# Patient Record
Sex: Female | Born: 1935 | Race: Black or African American | Hispanic: No | Smoking: Former smoker
Health system: Southern US, Community
[De-identification: ages and names within clinical notes are randomized; demographics above are authoritative.]

## PROBLEM LIST (undated history)

## (undated) DIAGNOSIS — Z972 Presence of dental prosthetic device (complete) (partial): Secondary | ICD-10-CM

## (undated) DIAGNOSIS — K219 Gastro-esophageal reflux disease without esophagitis: Secondary | ICD-10-CM

## (undated) DIAGNOSIS — I10 Essential (primary) hypertension: Secondary | ICD-10-CM

## (undated) DIAGNOSIS — E119 Type 2 diabetes mellitus without complications: Secondary | ICD-10-CM

## (undated) DIAGNOSIS — M1990 Unspecified osteoarthritis, unspecified site: Secondary | ICD-10-CM

## (undated) DIAGNOSIS — I2699 Other pulmonary embolism without acute cor pulmonale: Secondary | ICD-10-CM

## (undated) HISTORY — PX: ABDOMINAL HYSTERECTOMY: SHX81

## (undated) HISTORY — PX: CHOLECYSTECTOMY: SHX55

---

## 2004-12-05 ENCOUNTER — Emergency Department: Payer: Self-pay | Admitting: Emergency Medicine

## 2005-01-05 ENCOUNTER — Ambulatory Visit: Payer: Self-pay | Admitting: Ophthalmology

## 2005-01-10 ENCOUNTER — Ambulatory Visit: Payer: Self-pay | Admitting: Ophthalmology

## 2005-01-12 ENCOUNTER — Ambulatory Visit: Payer: Self-pay | Admitting: Internal Medicine

## 2005-01-16 ENCOUNTER — Ambulatory Visit: Payer: Self-pay | Admitting: Unknown Physician Specialty

## 2005-02-08 ENCOUNTER — Ambulatory Visit: Payer: Self-pay | Admitting: Internal Medicine

## 2005-03-23 ENCOUNTER — Ambulatory Visit: Payer: Self-pay | Admitting: Internal Medicine

## 2005-04-10 ENCOUNTER — Ambulatory Visit: Payer: Self-pay | Admitting: Internal Medicine

## 2005-06-28 ENCOUNTER — Ambulatory Visit: Payer: Self-pay | Admitting: Internal Medicine

## 2005-07-11 ENCOUNTER — Ambulatory Visit: Payer: Self-pay | Admitting: Internal Medicine

## 2005-09-21 ENCOUNTER — Ambulatory Visit: Payer: Self-pay | Admitting: Internal Medicine

## 2005-10-11 ENCOUNTER — Ambulatory Visit: Payer: Self-pay | Admitting: Internal Medicine

## 2006-01-22 ENCOUNTER — Ambulatory Visit: Payer: Self-pay | Admitting: Unknown Physician Specialty

## 2006-03-26 ENCOUNTER — Ambulatory Visit: Payer: Self-pay | Admitting: Internal Medicine

## 2006-04-10 ENCOUNTER — Ambulatory Visit: Payer: Self-pay | Admitting: Internal Medicine

## 2006-09-25 ENCOUNTER — Ambulatory Visit: Payer: Self-pay | Admitting: Internal Medicine

## 2006-10-11 ENCOUNTER — Ambulatory Visit: Payer: Self-pay | Admitting: Internal Medicine

## 2007-01-29 ENCOUNTER — Ambulatory Visit: Payer: Self-pay | Admitting: Unknown Physician Specialty

## 2007-08-01 ENCOUNTER — Ambulatory Visit: Payer: Self-pay | Admitting: Unknown Physician Specialty

## 2008-04-30 ENCOUNTER — Ambulatory Visit: Payer: Self-pay | Admitting: Unknown Physician Specialty

## 2009-05-04 ENCOUNTER — Ambulatory Visit: Payer: Self-pay | Admitting: Unknown Physician Specialty

## 2009-10-22 ENCOUNTER — Ambulatory Visit: Payer: Self-pay | Admitting: Unknown Physician Specialty

## 2010-01-10 ENCOUNTER — Ambulatory Visit: Payer: Self-pay | Admitting: Unknown Physician Specialty

## 2010-01-20 ENCOUNTER — Ambulatory Visit: Payer: Self-pay | Admitting: Unknown Physician Specialty

## 2010-01-26 ENCOUNTER — Ambulatory Visit: Payer: Self-pay | Admitting: Unknown Physician Specialty

## 2010-06-03 ENCOUNTER — Ambulatory Visit: Payer: Self-pay | Admitting: Unknown Physician Specialty

## 2010-11-27 ENCOUNTER — Emergency Department: Payer: Self-pay | Admitting: Internal Medicine

## 2011-06-27 ENCOUNTER — Ambulatory Visit: Payer: Self-pay | Admitting: Unknown Physician Specialty

## 2012-06-27 ENCOUNTER — Ambulatory Visit: Payer: Self-pay | Admitting: Unknown Physician Specialty

## 2013-06-09 ENCOUNTER — Ambulatory Visit: Payer: Self-pay | Admitting: Orthopedic Surgery

## 2013-09-30 ENCOUNTER — Ambulatory Visit: Payer: Self-pay | Admitting: Internal Medicine

## 2014-07-18 ENCOUNTER — Emergency Department: Payer: Self-pay | Admitting: Emergency Medicine

## 2014-07-18 LAB — COMPREHENSIVE METABOLIC PANEL
ALK PHOS: 90 U/L
ANION GAP: 7 (ref 7–16)
Albumin: 3.3 g/dL — ABNORMAL LOW (ref 3.4–5.0)
BILIRUBIN TOTAL: 0.5 mg/dL (ref 0.2–1.0)
BUN: 34 mg/dL — ABNORMAL HIGH (ref 7–18)
CO2: 25 mmol/L (ref 21–32)
Calcium, Total: 8.9 mg/dL (ref 8.5–10.1)
Chloride: 103 mmol/L (ref 98–107)
Creatinine: 1.48 mg/dL — ABNORMAL HIGH (ref 0.60–1.30)
EGFR (African American): 37 — ABNORMAL LOW
EGFR (Non-African Amer.): 32 — ABNORMAL LOW
Glucose: 189 mg/dL — ABNORMAL HIGH (ref 65–99)
Osmolality: 283 (ref 275–301)
Potassium: 4.1 mmol/L (ref 3.5–5.1)
SGOT(AST): 14 U/L — ABNORMAL LOW (ref 15–37)
SGPT (ALT): 20 U/L
SODIUM: 135 mmol/L — AB (ref 136–145)
Total Protein: 7.2 g/dL (ref 6.4–8.2)

## 2014-07-18 LAB — CBC WITH DIFFERENTIAL/PLATELET
Basophil #: 0.1 10*3/uL (ref 0.0–0.1)
Basophil %: 1.3 %
Eosinophil #: 0.1 10*3/uL (ref 0.0–0.7)
Eosinophil %: 1.6 %
HCT: 38.4 % (ref 35.0–47.0)
HGB: 13 g/dL (ref 12.0–16.0)
Lymphocyte #: 2 10*3/uL (ref 1.0–3.6)
Lymphocyte %: 33.3 %
MCH: 27.6 pg (ref 26.0–34.0)
MCHC: 33.8 g/dL (ref 32.0–36.0)
MCV: 82 fL (ref 80–100)
MONOS PCT: 7.1 %
Monocyte #: 0.4 x10 3/mm (ref 0.2–0.9)
NEUTROS ABS: 3.5 10*3/uL (ref 1.4–6.5)
NEUTROS PCT: 56.7 %
Platelet: 201 10*3/uL (ref 150–440)
RBC: 4.69 10*6/uL (ref 3.80–5.20)
RDW: 14.8 % — AB (ref 11.5–14.5)
WBC: 6.1 10*3/uL (ref 3.6–11.0)

## 2014-07-18 LAB — URINALYSIS, COMPLETE
BLOOD: NEGATIVE
Bilirubin,UR: NEGATIVE
Glucose,UR: NEGATIVE mg/dL (ref 0–75)
KETONE: NEGATIVE
Nitrite: NEGATIVE
PH: 6 (ref 4.5–8.0)
PROTEIN: NEGATIVE
RBC,UR: 3 /HPF (ref 0–5)
SPECIFIC GRAVITY: 1.005 (ref 1.003–1.030)

## 2014-07-18 LAB — LIPASE, BLOOD: LIPASE: 211 U/L (ref 73–393)

## 2014-12-15 ENCOUNTER — Ambulatory Visit: Payer: Self-pay | Admitting: Internal Medicine

## 2015-04-08 ENCOUNTER — Other Ambulatory Visit: Payer: Self-pay | Admitting: Internal Medicine

## 2015-04-08 DIAGNOSIS — R1032 Left lower quadrant pain: Secondary | ICD-10-CM

## 2015-04-14 ENCOUNTER — Ambulatory Visit: Payer: Self-pay

## 2015-04-15 ENCOUNTER — Ambulatory Visit
Admission: RE | Admit: 2015-04-15 | Discharge: 2015-04-15 | Disposition: A | Discharge status: Home / Self Care | Payer: Medicare Other | Source: Ambulatory Visit | Attending: Internal Medicine | Admitting: Internal Medicine

## 2015-04-15 DIAGNOSIS — K760 Fatty (change of) liver, not elsewhere classified: Secondary | ICD-10-CM | POA: Insufficient documentation

## 2015-04-15 DIAGNOSIS — K5730 Diverticulosis of large intestine without perforation or abscess without bleeding: Secondary | ICD-10-CM | POA: Diagnosis not present

## 2015-04-15 DIAGNOSIS — R1032 Left lower quadrant pain: Secondary | ICD-10-CM

## 2015-04-15 HISTORY — DX: Type 2 diabetes mellitus without complications: E11.9

## 2015-04-15 HISTORY — DX: Essential (primary) hypertension: I10

## 2015-04-15 MED ORDER — IOHEXOL 300 MG/ML  SOLN
100.0000 mL | Freq: Once | INTRAMUSCULAR | Status: AC | PRN
  Administered 2015-04-15: 100 mL via INTRAVENOUS

## 2015-10-15 ENCOUNTER — Emergency Department: Payer: Medicare Other

## 2015-10-15 ENCOUNTER — Encounter: Payer: Self-pay | Admitting: *Deleted

## 2015-10-15 ENCOUNTER — Emergency Department
Admission: EM | Admit: 2015-10-15 | Discharge: 2015-10-15 | Disposition: A | Discharge status: Home / Self Care | Payer: Medicare Other | Attending: Emergency Medicine | Admitting: Emergency Medicine

## 2015-10-15 DIAGNOSIS — R1032 Left lower quadrant pain: Secondary | ICD-10-CM | POA: Insufficient documentation

## 2015-10-15 DIAGNOSIS — I10 Essential (primary) hypertension: Secondary | ICD-10-CM | POA: Insufficient documentation

## 2015-10-15 DIAGNOSIS — R000 Tachycardia, unspecified: Secondary | ICD-10-CM | POA: Insufficient documentation

## 2015-10-15 DIAGNOSIS — E119 Type 2 diabetes mellitus without complications: Secondary | ICD-10-CM | POA: Diagnosis not present

## 2015-10-15 DIAGNOSIS — Z87891 Personal history of nicotine dependence: Secondary | ICD-10-CM | POA: Diagnosis not present

## 2015-10-15 DIAGNOSIS — R109 Unspecified abdominal pain: Secondary | ICD-10-CM | POA: Diagnosis present

## 2015-10-15 LAB — COMPREHENSIVE METABOLIC PANEL
ALBUMIN: 4.1 g/dL (ref 3.5–5.0)
ALT: 17 U/L (ref 14–54)
ANION GAP: 10 (ref 5–15)
AST: 21 U/L (ref 15–41)
Alkaline Phosphatase: 84 U/L (ref 38–126)
BUN: 26 mg/dL — ABNORMAL HIGH (ref 6–20)
CALCIUM: 9.9 mg/dL (ref 8.9–10.3)
CHLORIDE: 105 mmol/L (ref 101–111)
CO2: 25 mmol/L (ref 22–32)
Creatinine, Ser: 1.16 mg/dL — ABNORMAL HIGH (ref 0.44–1.00)
GFR calc non Af Amer: 41 mL/min — ABNORMAL LOW (ref >60)
GFR, EST AFRICAN AMERICAN: 48 mL/min — AB (ref >60)
GLUCOSE: 107 mg/dL — AB (ref 65–99)
POTASSIUM: 4.1 mmol/L (ref 3.5–5.1)
SODIUM: 140 mmol/L (ref 135–145)
Total Bilirubin: 0.8 mg/dL (ref 0.3–1.2)
Total Protein: 7.8 g/dL (ref 6.5–8.1)

## 2015-10-15 LAB — CBC
HEMATOCRIT: 39 % (ref 35.0–47.0)
HEMOGLOBIN: 13.3 g/dL (ref 12.0–16.0)
MCH: 28.1 pg (ref 26.0–34.0)
MCHC: 34.2 g/dL (ref 32.0–36.0)
MCV: 82.2 fL (ref 80.0–100.0)
Platelets: 181 10*3/uL (ref 150–440)
RBC: 4.74 MIL/uL (ref 3.80–5.20)
RDW: 14.4 % (ref 11.5–14.5)
WBC: 8.3 10*3/uL (ref 3.6–11.0)

## 2015-10-15 LAB — URINALYSIS COMPLETE WITH MICROSCOPIC (ARMC ONLY)
Bilirubin Urine: NEGATIVE
GLUCOSE, UA: NEGATIVE mg/dL
HGB URINE DIPSTICK: NEGATIVE
Ketones, ur: NEGATIVE mg/dL
Nitrite: NEGATIVE
PROTEIN: NEGATIVE mg/dL
SPECIFIC GRAVITY, URINE: 1.004 — AB (ref 1.005–1.030)
pH: 5 (ref 5.0–8.0)

## 2015-10-15 LAB — LIPASE, BLOOD: LIPASE: 32 U/L (ref 11–51)

## 2015-10-15 MED ORDER — SODIUM CHLORIDE 0.9 % IV BOLUS (SEPSIS)
500.0000 mL | INTRAVENOUS | Status: AC
  Administered 2015-10-15: 500 mL via INTRAVENOUS

## 2015-10-15 MED ORDER — IOHEXOL 240 MG/ML SOLN
25.0000 mL | Freq: Once | INTRAMUSCULAR | Status: AC | PRN
  Administered 2015-10-15: 25 mL via ORAL

## 2015-10-15 MED ORDER — HYDROCODONE-ACETAMINOPHEN 5-325 MG PO TABS: 1.0000 | ORAL_TABLET | ORAL | Status: DC | PRN

## 2015-10-15 MED ORDER — MORPHINE SULFATE (PF) 4 MG/ML IV SOLN
4.0000 mg | Freq: Once | INTRAVENOUS | Status: AC
  Administered 2015-10-15: 4 mg via INTRAVENOUS
  Filled 2015-10-15: qty 1

## 2015-10-15 MED ORDER — IOHEXOL 300 MG/ML  SOLN
100.0000 mL | Freq: Once | INTRAMUSCULAR | Status: AC | PRN
  Administered 2015-10-15: 100 mL via INTRAVENOUS

## 2015-10-15 MED ORDER — DOCUSATE SODIUM 100 MG PO CAPS: ORAL_CAPSULE | ORAL | Status: DC

## 2015-10-15 MED ORDER — ACETAMINOPHEN 500 MG PO TABS
1000.0000 mg | ORAL_TABLET | Freq: Once | ORAL | Status: AC
  Administered 2015-10-15: 1000 mg via ORAL
  Filled 2015-10-15: qty 2

## 2015-10-15 MED ORDER — ONDANSETRON HCL 4 MG/2ML IJ SOLN
4.0000 mg | Freq: Once | INTRAMUSCULAR | Status: AC
  Administered 2015-10-15: 4 mg via INTRAVENOUS
  Filled 2015-10-15: qty 2

## 2015-10-15 NOTE — ED Provider Notes (Signed)
[facility] Emergency Department Provider Note  ____________________________________________  Time seen: Approximately 4:16 PM  I have reviewed the triage vital signs and the nursing notes.   HISTORY  Chief Complaint Abdominal Pain    HPI Jillian Moore is a 81 y.o. female with IDDM and HTN presents with worsening LLQ abd pain for about 1 week.  She was prescribed Cipro and Flagyl as empiric treatment about 3 days ago, but she has gotten worse in spite of that.  She states that her pain was not significant 3 days ago, but it is severe today.  She describes it as a sharp stabbing pain in her left lower quadrant that radiates up into her left flank somewhat.  She denies nausea, vomiting, fever/chills, chest pain, shortness of breath, dysuria, hematuria.  She has never had any kidney stones.  Movement and pressure on her abdomen make the pain worse.  Rest makes it a bit better.  Past Medical History  Diagnosis Date  . Diabetes mellitus without complication (HCC)     Patient takes Metformin and Insulin  . Hypertension     There are no active problems to display for this patient.   No past surgical history on file.  Current Outpatient Rx  Name  Route  Sig  Dispense  Refill  . docusate sodium (COLACE) 100 MG capsule      Take 1 tablet once or twice daily as needed for constipation while taking narcotic pain medicine   30 capsule   0   . HYDROcodone-acetaminophen (NORCO/VICODIN) 5-325 MG tablet   Oral   Take 1-2 tablets by mouth every 4 (four) hours as needed for moderate pain.   15 tablet   0     Allergies Review of patient's allergies indicates no known allergies.  No family history on file.  Social History Social History  Substance Use Topics  . Smoking status: Former Games developer  . Smokeless tobacco: Not on file  . Alcohol Use: No    Review of Systems Constitutional: No fever/chills Eyes: No visual changes. ENT: No sore  throat. Cardiovascular: Denies chest pain. Respiratory: Denies shortness of breath. Gastrointestinal: Left lower quadrant abdominal pain.  No nausea, no vomiting.  No diarrhea.  No constipation. Genitourinary: Negative for dysuria. Musculoskeletal: Negative for back pain. Skin: Negative for rash. Neurological: Negative for headaches, focal weakness or numbness.  10-point ROS otherwise negative.  ____________________________________________   PHYSICAL EXAM:  VITAL SIGNS: ED Triage Vitals  Enc Vitals Group     BP 10/15/15 1304 155/86 mmHg     Pulse Rate 10/15/15 1304 105     Resp 10/15/15 1304 18     Temp 10/15/15 1304 98.3 F (36.8 C)     Temp Source 10/15/15 1304 Oral     SpO2 10/15/15 1304 99 %     Weight 10/15/15 1304 184 lb (83.462 kg)     Height 10/15/15 1304  (1.575 m)     Head Cir --      Peak Flow --      Pain Score 10/15/15 1305 7     Pain Loc --      Pain Edu? --      Excl. in GC? --     Constitutional: Alert and oriented. Well appearing and in no acute distress. Eyes: Conjunctivae are normal. PERRL. EOMI. Head: Atraumatic. Nose: No congestion/rhinnorhea. Mouth/Throat: Mucous membranes are moist.  Oropharynx non-erythematous. Neck: No stridor.   Cardiovascular: Tachycardic, regular rhythm. Grossly normal heart sounds.  Good peripheral circulation. Respiratory: Normal respiratory effort.  No retractions. Lungs CTAB. Gastrointestinal: Soft with moderate tenderness to palpation of the left lower quadrant with no rebound tenderness or guarding. No distention. No abdominal bruits. No CVA tenderness. Musculoskeletal: No lower extremity tenderness nor edema.  No joint effusions. Neurologic:  Normal speech and language. No gross focal neurologic deficits are appreciated.  Skin:  Skin is warm, dry and intact. No rash noted. Psychiatric: Mood and affect are normal. Speech and behavior are normal.  ____________________________________________   LABS (all labs  ordered are listed, but only abnormal results are displayed)  Labs Reviewed  COMPREHENSIVE METABOLIC PANEL - Abnormal; Notable for the following:    Glucose, Bld 107 (*)    BUN 26 (*)    Creatinine, Ser 1.16 (*)    GFR calc non Af Amer 41 (*)    GFR calc Af Amer 48 (*)    All other components within normal limits  URINALYSIS COMPLETEWITH MICROSCOPIC ([facility] ONLY) - Abnormal; Notable for the following:    Color, Urine STRAW (*)    APPearance CLEAR (*)    Specific Gravity, Urine 1.004 (*)    Leukocytes, UA 1+ (*)    Bacteria, UA RARE (*)    Squamous Epithelial / LPF 0-5 (*)    All other components within normal limits  LIPASE, BLOOD  CBC   ____________________________________________  EKG  ED ECG REPORT I, Gila Lauf, the attending physician, personally viewed and interpreted this ECG.  Date: 10/15/2015 EKG Time: 17:02 Rate: 107 Rhythm: Sinus tachycardia QRS Axis: normal Intervals: normal ST/T Wave abnormalities: normal Conduction Disutrbances: none Narrative Interpretation: unremarkable ____________________________________________  RADIOLOGY   Ct Abdomen Pelvis W Contrast  10/15/2015  CLINICAL DATA:  Left lower abdominal pain for the past week, increased. EXAM: CT ABDOMEN AND PELVIS WITH CONTRAST TECHNIQUE: Multidetector CT imaging of the abdomen and pelvis was performed using the standard protocol following bolus administration of intravenous contrast. CONTRAST:  100mL OMNIPAQUE IOHEXOL 300 MG/ML  SOLN COMPARISON:  04/15/2015. FINDINGS: Lower chest: Stable small amount of bronchiectasis in the medial aspect of the right lower lobe. Hepatobiliary: Cholecystectomy clips.  Unremarkable liver. Pancreas: No mass, inflammatory changes, or other significant abnormality. Spleen: Within normal limits in size and appearance. Adrenals/Urinary Tract: Small lower pole left renal cyst. No masses identified. No evidence of hydronephrosis. Stomach/Bowel: Scattered colonic diverticula  without evidence of diverticulitis. No evidence of appendicitis. No gastric or small bowel abnormalities. Vascular/Lymphatic: Atheromatous arterial calcifications. No enlarged lymph nodes. Reproductive: Surgically absent uterus.  No adnexal masses. Other: None. Musculoskeletal:  Lower thoracic spine degenerative changes. IMPRESSION: 1. No acute abnormality. 2. Colonic diverticulosis without evidence of diverticulitis. Electronically Signed   By: Beckie SaltsSteven  Reid M.D.   On: 10/15/2015 19:53    ____________________________________________   PROCEDURES  Procedure(s) performed: None  Critical Care performed: No ____________________________________________   INITIAL IMPRESSION / ASSESSMENT AND PLAN / ED COURSE  Pertinent labs & imaging results that were available during my care of the patient were reviewed by me and considered in my medical decision making (see chart for details).  Well appearing but mildly tachycardic with severe abdominal pain in spite of empiric antibiotics.  Labs unremarkable.  I will obtain a CT scan for further evaluation.  I suspect diverticulitis.  ----------------------------------------- 8:20 PM on 10/15/2015 -----------------------------------------  The patient has remained calm and quiet in the emergency department.  Her CT scan was unremarkable.  She continues to complain of pain in the left lower quadrant, but on reexamination  she has very minimal tenderness to palpation.  I explained to her that her CT scan is unremarkable, as are her labs.  I discussed the fact that she is slightly tachycardic but this may be due to her discomfort.  I had an extensive discussion with her about undifferentiated abdominal pain and explained that she should follow-up with her regular doctor as soon as possible but return to the emergency department with any new or worsening symptoms.  I recommended that she take over-the-counter pain medicine as needed and only use the Norco if  absolutely necessary for her discomfort she and her family member understand and agree with the plan. ____________________________________________  FINAL CLINICAL IMPRESSION(S) / ED DIAGNOSES  Final diagnoses:  LLQ abdominal pain      NEW MEDICATIONS STARTED DURING THIS VISIT:  New Prescriptions   DOCUSATE SODIUM (COLACE) 100 MG CAPSULE    Take 1 tablet once or twice daily as needed for constipation while taking narcotic pain medicine   HYDROCODONE-ACETAMINOPHEN (NORCO/VICODIN) 5-325 MG TABLET    Take 1-2 tablets by mouth every 4 (four) hours as needed for moderate pain.     Loleta Rose, MD 10/15/15 2021

## 2015-10-15 NOTE — ED Notes (Signed)
CT notified pt is done drinking contrast

## 2015-10-15 NOTE — ED Notes (Signed)
Recalled CT to see what time pt would be going over. No answer at this time. Pt updated on delay. Will continue to monitor pt and give updates as needed.

## 2015-10-15 NOTE — ED Notes (Signed)
Pt arrived to ED reporting left sided lower abd pain for the past week that has increased in severity. Pt described pain as a cramping. [facility] given Cipro and metronidazole. Pt deneis remembering a diagnosis but reports the MD told pt the antibiotics were preventative. Pt denies NVD. Pt reports pain increases with pressure and movement. Last bowel movement reported to be today.

## 2015-10-15 NOTE — ED Notes (Signed)
Explained to pt to continue antibiotics until finished per Dr. York CeriseForbach. Pt verbalizes understanding. Pt discharged with family at this time. Pt rolled to front entrance and assisted into vehicle.

## 2015-10-15 NOTE — ED Notes (Signed)
Pt back in room from CT. Pt assisted to restroom and then assisted back to bed.

## 2015-10-15 NOTE — ED Notes (Signed)
Patient transported to CT 

## 2015-10-15 NOTE — Discharge Instructions (Signed)
You have been seen in the Emergency Department (ED) for abdominal pain.  Your evaluation did not identify a clear cause of your symptoms but was generally reassuring. ° °Please follow up as instructed above regarding today’s emergent visit and the symptoms that are bothering you. ° °Return to the ED if your abdominal pain worsens or fails to improve, you develop bloody vomiting, bloody diarrhea, you are unable to tolerate fluids due to vomiting, fever greater than 101, or other symptoms that concern you. ° ° °Abdominal Pain, Adult °Many things can cause abdominal pain. Usually, abdominal pain is not caused by a disease and will improve without treatment. It can often be observed and treated at home. Your health care provider will do a physical exam and possibly order blood tests and X-rays to help determine the seriousness of your pain. However, in many cases, more time must pass before a clear cause of the pain can be found. Before that point, your health care provider may not know if you need more testing or further treatment. °HOME CARE INSTRUCTIONS °Monitor your abdominal pain for any changes. The following actions may help to alleviate any discomfort you are experiencing: °· Only take over-the-counter or prescription medicines as directed by your health care provider. °· Do not take laxatives unless directed to do so by your health care provider. °· Try a clear liquid diet (broth, tea, or water) as directed by your health care provider. Slowly move to a bland diet as tolerated. °SEEK MEDICAL CARE IF: °· You have unexplained abdominal pain. °· You have abdominal pain associated with nausea or diarrhea. °· You have pain when you urinate or have a bowel movement. °· You experience abdominal pain that wakes you in the night. °· You have abdominal pain that is worsened or improved by eating food. °· You have abdominal pain that is worsened with eating fatty foods. °· You have a fever. °SEEK IMMEDIATE MEDICAL CARE  IF: °· Your pain does not go away within 2 hours. °· You keep throwing up (vomiting). °· Your pain is felt only in portions of the abdomen, such as the right side or the left lower portion of the abdomen. °· You pass bloody or black tarry stools. °MAKE SURE YOU: °· Understand these instructions. °· Will watch your condition. °· Will get help right away if you are not doing well or get worse. °  °This information is not intended to replace advice given to you by your health care provider. Make sure you discuss any questions you have with your health care provider. °  °Document Released: 09/06/2005 Document Revised: 08/18/2015 Document Reviewed: 08/06/2013 °Elsevier Interactive Patient Education ©2016 [employer] ° °

## 2016-08-04 ENCOUNTER — Other Ambulatory Visit: Payer: Self-pay | Admitting: Internal Medicine

## 2016-08-04 DIAGNOSIS — Z1231 Encounter for screening mammogram for malignant neoplasm of breast: Secondary | ICD-10-CM

## 2016-08-04 DIAGNOSIS — Z1239 Encounter for other screening for malignant neoplasm of breast: Secondary | ICD-10-CM

## 2016-08-23 ENCOUNTER — Ambulatory Visit: Payer: Medicare Other | Attending: Internal Medicine

## 2016-09-15 ENCOUNTER — Other Ambulatory Visit: Payer: Self-pay | Admitting: Orthopedic Surgery

## 2016-09-15 DIAGNOSIS — M1712 Unilateral primary osteoarthritis, left knee: Secondary | ICD-10-CM

## 2016-09-15 DIAGNOSIS — M2392 Unspecified internal derangement of left knee: Secondary | ICD-10-CM

## 2016-09-28 ENCOUNTER — Ambulatory Visit
Admission: RE | Admit: 2016-09-28 | Discharge: 2016-09-28 | Disposition: A | Discharge status: Home / Self Care | Payer: Medicare Other | Source: Ambulatory Visit | Attending: Orthopedic Surgery | Admitting: Orthopedic Surgery

## 2016-09-28 DIAGNOSIS — M2392 Unspecified internal derangement of left knee: Secondary | ICD-10-CM | POA: Insufficient documentation

## 2016-09-28 DIAGNOSIS — M1712 Unilateral primary osteoarthritis, left knee: Secondary | ICD-10-CM | POA: Insufficient documentation

## 2016-12-11 DIAGNOSIS — I2699 Other pulmonary embolism without acute cor pulmonale: Secondary | ICD-10-CM

## 2016-12-11 HISTORY — DX: Other pulmonary embolism without acute cor pulmonale: I26.99

## 2017-01-25 ENCOUNTER — Other Ambulatory Visit: Payer: Self-pay | Admitting: Internal Medicine

## 2017-01-25 DIAGNOSIS — I2699 Other pulmonary embolism without acute cor pulmonale: Secondary | ICD-10-CM

## 2017-02-21 ENCOUNTER — Ambulatory Visit
Admission: RE | Admit: 2017-02-21 | Discharge: 2017-02-21 | Disposition: A | Discharge status: Home / Self Care | Payer: Medicare Other | Source: Ambulatory Visit | Attending: Internal Medicine | Admitting: Internal Medicine

## 2017-02-21 DIAGNOSIS — I708 Atherosclerosis of other arteries: Secondary | ICD-10-CM | POA: Diagnosis not present

## 2017-02-21 DIAGNOSIS — Z9049 Acquired absence of other specified parts of digestive tract: Secondary | ICD-10-CM | POA: Diagnosis not present

## 2017-02-21 DIAGNOSIS — I2699 Other pulmonary embolism without acute cor pulmonale: Secondary | ICD-10-CM | POA: Diagnosis present

## 2017-02-21 LAB — POCT I-STAT CREATININE: Creatinine, Ser: 1.1 mg/dL — ABNORMAL HIGH (ref 0.44–1.00)

## 2017-02-21 MED ORDER — IOPAMIDOL (ISOVUE-370) INJECTION 76%
75.0000 mL | Freq: Once | INTRAVENOUS | Status: AC | PRN
  Administered 2017-02-21: 75 mL via INTRAVENOUS

## 2017-04-16 ENCOUNTER — Encounter (INDEPENDENT_AMBULATORY_CARE_PROVIDER_SITE_OTHER): Payer: Self-pay | Admitting: Vascular Surgery

## 2017-04-16 ENCOUNTER — Ambulatory Visit (INDEPENDENT_AMBULATORY_CARE_PROVIDER_SITE_OTHER): Payer: Medicare Other | Admitting: Vascular Surgery

## 2017-04-16 DIAGNOSIS — I771 Stricture of artery: Secondary | ICD-10-CM | POA: Diagnosis not present

## 2017-04-16 DIAGNOSIS — I10 Essential (primary) hypertension: Secondary | ICD-10-CM | POA: Diagnosis not present

## 2017-04-16 DIAGNOSIS — E119 Type 2 diabetes mellitus without complications: Secondary | ICD-10-CM | POA: Insufficient documentation

## 2017-04-16 DIAGNOSIS — I2699 Other pulmonary embolism without acute cor pulmonale: Secondary | ICD-10-CM

## 2017-04-16 DIAGNOSIS — E1159 Type 2 diabetes mellitus with other circulatory complications: Secondary | ICD-10-CM

## 2017-04-16 NOTE — Progress Notes (Signed)
MRN : [id number]  Jillian Moore is a 83 y.o. (June 03, 1926) female who presents with chief complaint of  Chief Complaint  Patient presents with  . Follow-up  .  History of Present Illness: The patient is seen for evaluation of left subclavian stenosis. The left subclavian stenosis was identified incidentally by CT angiogram of the chest for PE..  The patient denies left hand pain.  She does c/o left shoulder pain and notes it is always localized to the shoulder and she has had a rotator cuff injury here in the past. There is no recent history of positional syncope. There is no prior documented CVA.  About a month or so ago she developed a PE during a long trip to [place].  She has not had any further symptoms since the initial presentation.  There is no history of migraine headaches. There is no history of seizures.  The patient is taking enteric-coated aspirin 81 mg daily and .  The patient denies a history of coronary artery disease, no recent episodes of angina or shortness of breath. The patient denies PAD or claudication symptoms. There is a history of hyperlipidemia which is being treated with a statin.    Current Meds  Medication Sig  . ALPRAZolam (XANAX) 0.25 MG tablet TAKE ONE TABLET BY MOUTH EVERY DAY AS NEEDED FOR ANXIETY  . amLODipine (NORVASC) 5 MG tablet   . docusate sodium (COLACE) 100 MG capsule Take 1 tablet once or twice daily as needed for constipation while taking narcotic pain medicine  . ELIQUIS 5 MG TABS tablet   . Fe Fum-FePoly-Vit C-Vit B3 (INTEGRA) 62.5-62.5-40-3 MG CAPS Take 1 capsule by mouth daily.  Marland Kitchen gabapentin (NEURONTIN) 100 MG capsule   . GNP EASY TOUCH GLUCOSE TEST test strip USE TO CHECK BLOOD SUGAR 3 TIMES DAILY  . insulin aspart (NOVOLOG) 100 UNIT/ML FlexPen Take 3-15 units up to three times daily as directed. Max dose 50 units daily.  . Insulin Pen Needle (FIFTY50 PEN NEEDLES) 31G X 8 MM MISC Use one needle twice daily as directed  .  LANTUS SOLOSTAR 100 UNIT/ML Solostar Pen   . metFORMIN (GLUCOPHAGE) 1000 MG tablet   . potassium chloride (MICRO-K) 10 MEQ CR capsule Take by mouth.  . TRADJENTA 5 MG TABS tablet   . triamcinolone cream (KENALOG) 0.1 % Reported on 01/04/2016  . valsartan-hydrochlorothiazide (DIOVAN-HCT) 320-25 MG tablet     Past Medical History:  Diagnosis Date  . Diabetes mellitus without complication (HCC)    Patient takes Metformin and Insulin  . Hypertension     Past Surgical History:  Procedure Laterality Date  . ABDOMINAL HYSTERECTOMY    . CHOLECYSTECTOMY      Social History Social History  Substance Use Topics  . Smoking status: Former Games developer  . Smokeless tobacco: Never Used  . Alcohol use No    Family History Family History  Problem Relation Age of Onset  . Dementia Mother   . Diabetes Father   . Cancer Paternal Aunt   No family history of bleeding/clotting disorders, porphyria or autoimmune disease   Allergies  Allergen Reactions  . Penicillins Rash     REVIEW OF SYSTEMS (Negative unless checked)  Constitutional: [] Weight loss  [] Fever  [] Chills Cardiac: [] Chest pain   [] Chest pressure   [] Palpitations   [] Shortness of breath when laying flat   [] Shortness of breath with exertion. Vascular:  [] Pain in legs with walking   [] Pain in legs at rest  [] History of DVT   []   Phlebitis   [] Swelling in legs   [] Varicose veins   [] Non-healing ulcers Pulmonary:   [] Uses home oxygen   [] Productive cough   [] Hemoptysis   [] Wheeze  [] COPD   [] Asthma Neurologic:  [] Dizziness   [] Seizures   [] History of stroke   [] History of TIA  [] Aphasia   [] Vissual changes   [] Weakness or numbness in arm   [] Weakness or numbness in leg Musculoskeletal:   [] Joint swelling   [x] Joint pain   [] Low back pain Hematologic:  [x] Easy bruising  [] Easy bleeding   [] Hypercoagulable state   [] Anemic Gastrointestinal:  [] Diarrhea   [] Vomiting  [] Gastroesophageal reflux/heartburn   [] Difficulty  swallowing. Genitourinary:  [x] Chronic kidney disease   [] Difficult urination  [] Frequent urination   [] Blood in urine Skin:  [] Rashes   [] Ulcers  Psychological:  [] History of anxiety   []  History of major depression.  Physical Examination  Vitals:   04/16/17 1442  BP: 115/77  Pulse: 84  Resp: 16  Weight: 168 lb (76.2 kg)  Height: 5' (1.524 m)   Body mass index is 32.81 kg/m. Gen: WD/WN, NAD Head: [place]/AT, No temporalis wasting.  Ear/Nose/Throat: Hearing grossly intact, nares w/o erythema or drainage, poor dentition Eyes: PER, EOMI, sclera nonicteric.  Neck: Supple, no masses.  No bruit or JVD.  Pulmonary:  Good air movement, clear to auscultation bilaterally, no use of accessory muscles.  Cardiac: RRR, normal S1, S2, no Murmurs. Vascular: left carotid and left subclavian bruit noted Vessel Right Left  Radial Palpable Trace Palpable  Ulnar Palpable Not Palpable  Brachial Palpable 1+ Palpable  Carotid Palpable Palpable  Gastrointestinal: soft, non-distended. No guarding/no peritoneal signs.  Musculoskeletal: M/S 5/5 throughout.  No deformity or atrophy.  Neurologic: CN 2-12 intact. Pain and light touch intact in extremities.  Symmetrical.  Speech is fluent. Motor exam as listed above. Psychiatric: Judgment intact, Mood & affect appropriate for pt's clinical situation. Dermatologic: No rashes or ulcers noted.  No changes consistent with cellulitis. Lymph : No Cervical lymphadenopathy, no lichenification or skin changes of chronic lymphedema.  CBC Lab Results  Component Value Date   WBC 8.3 10/15/2015   HGB 13.3 10/15/2015   HCT 39.0 10/15/2015   MCV 82.2 10/15/2015   PLT 181 10/15/2015    BMET    Component Value Date/Time   NA 140 10/15/2015 1308   NA 135 (L) 07/18/2014 1121   K 4.1 10/15/2015 1308   K 4.1 07/18/2014 1121   CL 105 10/15/2015 1308   CL 103 07/18/2014 1121   CO2 25 10/15/2015 1308   CO2 25 07/18/2014 1121   GLUCOSE 107 (H) 10/15/2015 1308    GLUCOSE 189 (H) 07/18/2014 1121   BUN 26 (H) 10/15/2015 1308   BUN 34 (H) 07/18/2014 1121   CREATININE 1.10 (H) 02/21/2017 0851   CREATININE 1.48 (H) 07/18/2014 1121   CALCIUM 9.9 10/15/2015 1308   CALCIUM 8.9 07/18/2014 1121   GFRNONAA 41 (L) 10/15/2015 1308   GFRNONAA 32 (L) 07/18/2014 1121   GFRAA 48 (L) 10/15/2015 1308   GFRAA 37 (L) 07/18/2014 1121   CrCl cannot be calculated (Patient's most recent lab result is older than the maximum 21 days allowed.).  COAG No results found for: INR, PROTIME  Radiology No results found.  Assessment/Plan 1. Subclavian arterial stenosis (HCC) Recommend:  Given the patient's  Left subclavian stenosis is asymptomatic no further invasive testing or surgery at this time.  Duplex ultrasound of the carotid has already been ordered by Dr Gwen PoundsKowalski.  This  will help evaluated both any ICA stenosis as well as whether the left vertebral is antegrade.  Continue antiplatelet therapy as prescribed Continue management of CAD, HTN and Hyperlipidemia Healthy heart diet,  encouraged exercise at least 4 times per week Follow up PRN pending the results of the carotid US  2. Essential hypertension Continue antihypertensive medications as already ordered, these medications have been reviewed and there are no changes at this time.   3. Other acute pulmonary embolism without acute cor pulmonale (HCC) She will continue Eliquis for several more months   4. Type 2 diabetes mellitus with other circulatory complication, without long-term current use of insulin (HCC) Continue hypoglycemic medications as already ordered, these medications have been reviewed and there are no changes at this time.  Hgb A1C to be monitored as already arranged by primary service     Levora Dredge, MD  04/16/2017 5:11 PM

## 2017-08-02 ENCOUNTER — Emergency Department: Payer: Medicare Other

## 2017-08-02 ENCOUNTER — Emergency Department
Admission: EM | Admit: 2017-08-02 | Discharge: 2017-08-02 | Disposition: A | Discharge status: Home / Self Care | Payer: Medicare Other | Attending: Emergency Medicine | Admitting: Emergency Medicine

## 2017-08-02 ENCOUNTER — Encounter: Payer: Self-pay | Admitting: Emergency Medicine

## 2017-08-02 DIAGNOSIS — Z87891 Personal history of nicotine dependence: Secondary | ICD-10-CM | POA: Diagnosis not present

## 2017-08-02 DIAGNOSIS — K5900 Constipation, unspecified: Secondary | ICD-10-CM

## 2017-08-02 DIAGNOSIS — E119 Type 2 diabetes mellitus without complications: Secondary | ICD-10-CM | POA: Diagnosis not present

## 2017-08-02 DIAGNOSIS — Z79899 Other long term (current) drug therapy: Secondary | ICD-10-CM | POA: Diagnosis not present

## 2017-08-02 DIAGNOSIS — I10 Essential (primary) hypertension: Secondary | ICD-10-CM | POA: Insufficient documentation

## 2017-08-02 DIAGNOSIS — Z7901 Long term (current) use of anticoagulants: Secondary | ICD-10-CM | POA: Diagnosis not present

## 2017-08-02 HISTORY — DX: Other pulmonary embolism without acute cor pulmonale: I26.99

## 2017-08-02 LAB — COMPREHENSIVE METABOLIC PANEL
ALBUMIN: 3.9 g/dL (ref 3.5–5.0)
ALT: 22 U/L (ref 14–54)
AST: 26 U/L (ref 15–41)
Alkaline Phosphatase: 68 U/L (ref 38–126)
Anion gap: 14 (ref 5–15)
BUN: 30 mg/dL — AB (ref 6–20)
CHLORIDE: 100 mmol/L — AB (ref 101–111)
CO2: 23 mmol/L (ref 22–32)
CREATININE: 1.18 mg/dL — AB (ref 0.44–1.00)
Calcium: 9.5 mg/dL (ref 8.9–10.3)
GFR calc Af Amer: 46 mL/min — ABNORMAL LOW (ref >60)
GFR, EST NON AFRICAN AMERICAN: 40 mL/min — AB (ref >60)
GLUCOSE: 314 mg/dL — AB (ref 65–99)
POTASSIUM: 3.5 mmol/L (ref 3.5–5.1)
SODIUM: 137 mmol/L (ref 135–145)
Total Bilirubin: 0.7 mg/dL (ref 0.3–1.2)
Total Protein: 7.4 g/dL (ref 6.5–8.1)

## 2017-08-02 LAB — CBC
HCT: 38.2 % (ref 35.0–47.0)
Hemoglobin: 13.3 g/dL (ref 12.0–16.0)
MCH: 29.3 pg (ref 26.0–34.0)
MCHC: 34.7 g/dL (ref 32.0–36.0)
MCV: 84.5 fL (ref 80.0–100.0)
PLATELETS: 256 10*3/uL (ref 150–440)
RBC: 4.53 MIL/uL (ref 3.80–5.20)
RDW: 14.2 % (ref 11.5–14.5)
WBC: 10 10*3/uL (ref 3.6–11.0)

## 2017-08-02 MED ORDER — DOCUSATE SODIUM 100 MG PO CAPS: 100.0000 mg | ORAL_CAPSULE | Freq: Every day | ORAL | 2 refills | Status: AC

## 2017-08-02 NOTE — ED Notes (Signed)
D/w Dr. Darnelle Catalan, new orders received from CBC, CMP, KUB and UA.  Pt already had UA and XR at [facility]. Pt did not inform me she was at [facility] prior to coming to ED when spoke with Dr. Darnelle Catalan.  Will order lab work.

## 2017-08-02 NOTE — ED Notes (Signed)
Pt tolerated enema well. Great return.

## 2017-08-02 NOTE — ED Provider Notes (Signed)
[facility] Emergency Department Provider Note  Time seen: 4:14 PM  I have reviewed the triage vital signs and the nursing notes.   HISTORY  Chief Complaint Constipation    HPI Jillian Moore is a 83 y.o. female with a past medical history of diabetes, hypertension, presents to the emergency department for constipation. According to the patient for the past 4 days she has not been able to have a bowel movement. States she will have very small bowel movements but mostly liquid stool. Went to walk-in clinic today with a took an x-ray saying that she was impacted and sent her to the emergency department for further treatment. Here the patient states intermittent abdominal cramping although denies any pain currently. Denies any nausea or vomiting. Patient states her last normal bowel movement was 4 days ago. Patient has been taking laxatives at home without relief.  Past Medical History:  Diagnosis Date  . Diabetes mellitus without complication (HCC)    Patient takes Metformin and Insulin  . Hypertension   . Pulmonary emboli [facility])     Patient Active Problem List   Diagnosis Date Noted  . Subclavian arterial stenosis (HCC) 04/16/2017  . Essential hypertension 04/16/2017  . Acute pulmonary embolism (HCC) 04/16/2017  . Diabetes (HCC) 04/16/2017    Past Surgical History:  Procedure Laterality Date  . ABDOMINAL HYSTERECTOMY    . CHOLECYSTECTOMY      Prior to Admission medications   Medication Sig Start Date End Date Taking? Authorizing Provider  ALPRAZolam (XANAX) 0.25 MG tablet TAKE ONE TABLET BY MOUTH EVERY DAY AS NEEDED FOR ANXIETY 03/13/17   [provider]  amLODipine (NORVASC) 5 MG tablet  04/12/17   [provider]  docusate sodium (COLACE) 100 MG capsule Take 1 tablet once or twice daily as needed for constipation while taking narcotic pain medicine 10/15/15   Loleta Rose, MD  ELIQUIS 5 MG TABS tablet  04/12/17   [provider]   Fe Fum-FePoly-Vit C-Vit B3 (INTEGRA) 62.5-62.5-40-3 MG CAPS Take 1 capsule by mouth daily. 03/26/17   [provider]  gabapentin (NEURONTIN) 100 MG capsule  03/26/17   [provider]  GNP EASY TOUCH GLUCOSE TEST test strip USE TO CHECK BLOOD SUGAR 3 TIMES DAILY 01/29/17   [provider]  HYDROcodone-acetaminophen (NORCO/VICODIN) 5-325 MG tablet Take 1-2 tablets by mouth every 4 (four) hours as needed for moderate pain. Patient not taking: Reported on 04/16/2017 10/15/15   Loleta Rose, MD  insulin aspart (NOVOLOG) 100 UNIT/ML FlexPen Take 3-15 units up to three times daily as directed. Max dose 50 units daily. 07/18/16   [provider]  Insulin Pen Needle (FIFTY50 PEN NEEDLES) 31G X 8 MM MISC Use one needle twice daily as directed 06/22/15   [provider]  LANTUS SOLOSTAR 100 UNIT/ML Solostar Pen  04/12/17   [provider]  metFORMIN (GLUCOPHAGE) 1000 MG tablet  03/26/17   [provider]  potassium chloride (MICRO-K) 10 MEQ CR capsule Take by mouth.    [provider]  TRADJENTA 5 MG TABS tablet  04/12/17   [provider]  triamcinolone cream (KENALOG) 0.1 % Reported on 01/04/2016 09/17/14   [provider]  valsartan-hydrochlorothiazide (DIOVAN-HCT) 320-25 MG tablet  03/12/17   [provider]    Allergies  Allergen Reactions  . Penicillins Rash    Family History  Problem Relation Age of Onset  . Dementia Mother   . Diabetes Father   . Cancer Paternal  Aunt     Social History Social History  Substance Use Topics  . Smoking status: Former Games developer  . Smokeless tobacco: Never Used  . Alcohol use No    Review of Systems Constitutional: Negative for fever. Cardiovascular: Negative for chest pain. Respiratory: Negative for shortness of breath. Gastrointestinal: Negative for abdominal pain, vomiting. Positive for constipation. Genitourinary: Negative for dysuria. Neurological: Negative for  headache All other ROS negative  ____________________________________________   PHYSICAL EXAM:  VITAL SIGNS: ED Triage Vitals  Enc Vitals Group     BP 08/02/17 1508 (!) 140/93     Pulse Rate 08/02/17 1508 (!) 109     Resp 08/02/17 1508 18     Temp 08/02/17 1508 97.9 F (36.6 C)     Temp Source 08/02/17 1508 Oral     SpO2 08/02/17 1508 100 %     Weight 08/02/17 1508 160 lb (72.6 kg)     Height 08/02/17 1508 5\' 1"  (1.549 m)     Head Circumference --      Peak Flow --      Pain Score 08/02/17 1507 9     Pain Loc --      Pain Edu? --      Excl. in GC? --     Constitutional: Alert and oriented. Well appearing and in no distress. Eyes: Normal exam ENT   Head: Normocephalic and atraumatic.   Mouth/Throat: Mucous membranes are moist. Cardiovascular: Normal rate, regular rhythm. No murmur Respiratory: Normal respiratory effort without tachypnea nor retractions. Breath sounds are clear Gastrointestinal: Soft and nontender. No distention. Musculoskeletal: Nontender with normal range of motion in all extremities.  Neurologic:  Normal speech and language. No gross focal neurologic deficits Skin:  Skin is warm, dry and intact.  Psychiatric: Mood and affect are normal.   ____________________________________________    INITIAL IMPRESSION / ASSESSMENT AND PLAN / ED COURSE  Pertinent labs & imaging results that were available during my care of the patient were reviewed by me and considered in my medical decision making (see chart for details).  Patient presents for constipation 4 days. X-ray performed at the walk-in clinic shows likely fecal impaction. On rectal examination the patient had hard stool in the rectal vault, was disimpacted by myself. We will perform an enema. Patient has a nontender exam overall appears well with no distress.  ------------------------------------------------------------------------------------------------------------------- Fecal Disimpaction  Procedure Note:  Performed by me:  Patient placed in the lateral recumbent position with knees drawn towards chest. Nurse present for patient support. Large amount of hard brown stool removed. No complications during procedure.   ------------------------------------------------------------------------------------------------------------------  Significant fecal output after soapsuds enema. Patient is feeling much better. We will discharge home with primary care follow-up. Patient agreeable. I discussed the patient's hyperglycemia with the patient as well as drinking plenty of non-sugary fluids and rechecking her fingerstick later this evening.  ____________________________________________   FINAL CLINICAL IMPRESSION(S) / ED DIAGNOSES  Constipation    Minna Antis, MD 08/02/17 1714

## 2017-08-02 NOTE — ED Notes (Signed)
Dr. Lenard Lance notified that soap suds enema was completed and that patient was able to get relief.

## 2017-08-02 NOTE — ED Notes (Addendum)
Pt states she has been constipated since this morning. States she hasn't had a bowel movement since Monday. Pt took some stool softners this morning. Family at the bedside.

## 2017-08-02 NOTE — ED Triage Notes (Addendum)
PT arrived via POV from home alone and is accompanied by daughter. Pt states she has not had a normal BM since Saturday and Sunday, pt states she has been able to just small amounts and has been passing gas.  Pt states she is having abdominal cramping caused from being constipated. Pt denies any nausea or vomiting. Pt states she does not take any narcotics and has been taking Miralax and stool softeners without relief. Pt states she has been eating and drinking without difficulty.

## 2017-09-11 ENCOUNTER — Encounter: Payer: Self-pay | Admitting: *Deleted

## 2017-09-13 NOTE — Discharge Instructions (Signed)

## 2017-09-18 ENCOUNTER — Ambulatory Visit
Admission: RE | Admit: 2017-09-18 | Discharge: 2017-09-18 | Disposition: A | Discharge status: Home / Self Care | Payer: Medicare Other | Source: Ambulatory Visit | Attending: Ophthalmology | Admitting: Ophthalmology

## 2017-09-18 ENCOUNTER — Encounter
Admission: RE | Disposition: A | Discharge status: Home / Self Care | Payer: Self-pay | Source: Ambulatory Visit | Attending: Ophthalmology

## 2017-09-18 ENCOUNTER — Ambulatory Visit: Payer: Medicare Other | Admitting: Anesthesiology

## 2017-09-18 DIAGNOSIS — I10 Essential (primary) hypertension: Secondary | ICD-10-CM | POA: Diagnosis not present

## 2017-09-18 DIAGNOSIS — Z87891 Personal history of nicotine dependence: Secondary | ICD-10-CM | POA: Insufficient documentation

## 2017-09-18 DIAGNOSIS — Z6830 Body mass index (BMI) 30.0-30.9, adult: Secondary | ICD-10-CM | POA: Insufficient documentation

## 2017-09-18 DIAGNOSIS — M1990 Unspecified osteoarthritis, unspecified site: Secondary | ICD-10-CM | POA: Insufficient documentation

## 2017-09-18 DIAGNOSIS — I739 Peripheral vascular disease, unspecified: Secondary | ICD-10-CM | POA: Insufficient documentation

## 2017-09-18 DIAGNOSIS — E1136 Type 2 diabetes mellitus with diabetic cataract: Secondary | ICD-10-CM | POA: Insufficient documentation

## 2017-09-18 DIAGNOSIS — E669 Obesity, unspecified: Secondary | ICD-10-CM | POA: Insufficient documentation

## 2017-09-18 DIAGNOSIS — K219 Gastro-esophageal reflux disease without esophagitis: Secondary | ICD-10-CM | POA: Insufficient documentation

## 2017-09-18 HISTORY — DX: Presence of dental prosthetic device (complete) (partial): Z97.2

## 2017-09-18 HISTORY — PX: CATARACT EXTRACTION W/PHACO: SHX586

## 2017-09-18 HISTORY — DX: Unspecified osteoarthritis, unspecified site: M19.90

## 2017-09-18 HISTORY — DX: Gastro-esophageal reflux disease without esophagitis: K21.9

## 2017-09-18 LAB — GLUCOSE, CAPILLARY
GLUCOSE-CAPILLARY: 161 mg/dL — AB (ref 65–99)
Glucose-Capillary: 158 mg/dL — ABNORMAL HIGH (ref 65–99)

## 2017-09-18 SURGERY — PHACOEMULSIFICATION, CATARACT, WITH IOL INSERTION
Anesthesia: Monitor Anesthesia Care | Laterality: Right | Wound class: Clean

## 2017-09-18 MED ORDER — ACETAMINOPHEN 325 MG PO TABS: 325.0000 mg | ORAL_TABLET | ORAL | Status: DC | PRN

## 2017-09-18 MED ORDER — MOXIFLOXACIN HCL 0.5 % OP SOLN
OPHTHALMIC | Status: DC | PRN
  Administered 2017-09-18: 0.2 mL via OPHTHALMIC

## 2017-09-18 MED ORDER — SODIUM HYALURONATE 23 MG/ML IO SOLN
INTRAOCULAR | Status: DC | PRN
  Administered 2017-09-18: 0.6 mL via INTRAOCULAR

## 2017-09-18 MED ORDER — LABETALOL HCL 5 MG/ML IV SOLN: 10.0000 mg | Freq: Once | INTRAVENOUS | Status: DC

## 2017-09-18 MED ORDER — ONDANSETRON HCL 4 MG/2ML IJ SOLN: 4.0000 mg | Freq: Once | INTRAMUSCULAR | Status: DC | PRN

## 2017-09-18 MED ORDER — ARMC OPHTHALMIC DILATING DROPS
1.0000 "application " | OPHTHALMIC | Status: DC | PRN
  Administered 2017-09-18 (×3): 1 via OPHTHALMIC

## 2017-09-18 MED ORDER — ACETAMINOPHEN 160 MG/5ML PO SOLN: 325.0000 mg | ORAL | Status: DC | PRN

## 2017-09-18 MED ORDER — EPINEPHRINE PF 1 MG/ML IJ SOLN
INTRAOCULAR | Status: DC | PRN
  Administered 2017-09-18: 170 mL via OPHTHALMIC

## 2017-09-18 MED ORDER — LIDOCAINE HCL (PF) 2 % IJ SOLN
INTRAOCULAR | Status: DC | PRN
  Administered 2017-09-18: 1 mL via INTRAOCULAR

## 2017-09-18 MED ORDER — LACTATED RINGERS IV SOLN: 10.0000 mL/h | INTRAVENOUS | Status: DC

## 2017-09-18 MED ORDER — MIDAZOLAM HCL 2 MG/2ML IJ SOLN
INTRAMUSCULAR | Status: DC | PRN
  Administered 2017-09-18: 2 mg via INTRAVENOUS

## 2017-09-18 MED ORDER — FENTANYL CITRATE (PF) 100 MCG/2ML IJ SOLN
INTRAMUSCULAR | Status: DC | PRN
  Administered 2017-09-18: 50 ug via INTRAVENOUS

## 2017-09-18 MED ORDER — SODIUM HYALURONATE 10 MG/ML IO SOLN
INTRAOCULAR | Status: DC | PRN
  Administered 2017-09-18: 0.55 mL via INTRAOCULAR

## 2017-09-18 SURGICAL SUPPLY — 16 items
CANNULA ANT/CHMB 27GA (MISCELLANEOUS) ×2 IMPLANT
DISSECTOR HYDRO NUCLEUS 50X22 (MISCELLANEOUS) ×2 IMPLANT
GLOVE BIO SURGEON STRL SZ8 (GLOVE) ×2 IMPLANT
GLOVE SURG LX 7.5 STRW (GLOVE) ×1
GLOVE SURG LX STRL 7.5 STRW (GLOVE) ×1 IMPLANT
GOWN STRL REUS W/ TWL LRG LVL3 (GOWN DISPOSABLE) ×2 IMPLANT
GOWN STRL REUS W/TWL LRG LVL3 (GOWN DISPOSABLE) ×2
LENS IOL ACRYSOF IQ 22.0 (Intraocular Lens) ×2 IMPLANT
MARKER SKIN DUAL TIP RULER LAB (MISCELLANEOUS) ×2 IMPLANT
PACK CATARACT (MISCELLANEOUS) ×2 IMPLANT
PACK DR. KING ARMS (PACKS) ×2 IMPLANT
PACK EYE AFTER SURG (MISCELLANEOUS) ×2 IMPLANT
SYR 3ML LL SCALE MARK (SYRINGE) ×2 IMPLANT
SYR TB 1ML LUER SLIP (SYRINGE) ×2 IMPLANT
WATER STERILE IRR 500ML POUR (IV SOLUTION) ×2 IMPLANT
WIPE NON LINTING 3.25X3.25 (MISCELLANEOUS) ×2 IMPLANT

## 2017-09-18 NOTE — Op Note (Signed)
OPERATIVE NOTE  Jillian Moore [id number] 09/18/2017   PREOPERATIVE DIAGNOSIS:  Nuclear sclerotic cataract right eye.  H25.11   POSTOPERATIVE DIAGNOSIS:    Nuclear sclerotic cataract right eye.     PROCEDURE:  Phacoemusification with posterior chamber intraocular lens placement of the right eye   LENS:   Implant Name Type Inv. Item Serial No. Manufacturer Lot No. LRB No. Used  LENS IOL ACRYSOF IQ 22.0 - [id number] Intraocular Lens LENS IOL ACRYSOF IQ 22.0 [id number] ALCON   Right 1       [id number] +22.0   ULTRASOUND TIME: 1 minutes 10 seconds.  CDE 9.18   SURGEON:  Willey Blade, MD, MPH  ANESTHESIOLOGIST: Anesthesiologist: Beckey Downing, MD CRNA: Andee Poles, CRNA   ANESTHESIA:  Topical with tetracaine drops augmented with 1% preservative-free intracameral lidocaine.  ESTIMATED BLOOD LOSS: less than 1 mL.   COMPLICATIONS:  None.   DESCRIPTION OF PROCEDURE:  The patient was identified in the holding room and transported to the operating room and placed in the supine position under the operating microscope.  The right eye was identified as the operative eye and it was prepped and draped in the usual sterile ophthalmic fashion.   A 1.0 millimeter clear-corneal paracentesis was made at the 10:30 position. 0.5 ml of preservative-free 1% lidocaine with epinephrine was injected into the anterior chamber.  The anterior chamber was filled with Healon 5 viscoelastic.  A 2.4 millimeter keratome was used to make a near-clear corneal incision at the 8:00 position.  A curvilinear capsulorrhexis was made with a cystotome and capsulorrhexis forceps.  Balanced salt solution was used to hydrodissect and hydrodelineate the nucleus.   Phacoemulsification was then used in stop and chop fashion to remove the lens nucleus and epinucleus.  The remaining cortex was then removed using the irrigation and aspiration handpiece. Healon was then placed into the capsular bag to distend it for lens placement.  A  lens was then injected into the capsular bag.  The remaining viscoelastic was aspirated.   Wounds were hydrated with balanced salt solution.  The anterior chamber was inflated to a physiologic pressure with balanced salt solution.   Intracameral vigamox 0.1 mL undiluted was injected into the eye and a drop placed onto the ocular surface.  No wound leaks were noted.  The patient was taken to the recovery room in stable condition without complications of anesthesia or surgery  Willey Blade 09/18/2017, 9:26 AM

## 2017-09-18 NOTE — Transfer of Care (Signed)
Immediate Anesthesia Transfer of Care Note  Patient: Jillian Moore  Procedure(s) Performed: CATARACT EXTRACTION PHACO AND INTRAOCULAR LENS PLACEMENT (IOC)  DIABETIC RIGHT (Right )  Patient Location: PACU  Anesthesia Type: MAC  Level of Consciousness: awake, alert  and patient cooperative  Airway and Oxygen Therapy: Patient Spontanous Breathing and Patient connected to supplemental oxygen  Post-op Assessment: Post-op Vital signs reviewed, Patient's Cardiovascular Status Stable, Respiratory Function Stable, Patent Airway and No signs of Nausea or vomiting  Post-op Vital Signs: Reviewed and stable  Complications: No apparent anesthesia complications

## 2017-09-18 NOTE — Anesthesia Procedure Notes (Signed)
Procedure Name: MAC Performed by: Yacob Wilkerson Pre-anesthesia Checklist: Patient identified, Emergency Drugs available, Suction available, Timeout performed and Patient being monitored Patient Re-evaluated:Patient Re-evaluated prior to inductionOxygen Delivery Method: Nasal cannula Placement Confirmation: positive ETCO2     

## 2017-09-18 NOTE — Anesthesia Postprocedure Evaluation (Signed)
Anesthesia Post Note  Patient: Jillian Moore  Procedure(s) Performed: CATARACT EXTRACTION PHACO AND INTRAOCULAR LENS PLACEMENT (IOC)  DIABETIC RIGHT (Right )  Patient location during evaluation: PACU Anesthesia Type: MAC Level of consciousness: awake Vital Signs Assessment: post-procedure vital signs reviewed and stable Respiratory status: spontaneous breathing Cardiovascular status: blood pressure returned to baseline and stable Postop Assessment: no headache Anesthetic complications: no    Lavonna Monarch

## 2017-09-18 NOTE — Anesthesia Preprocedure Evaluation (Addendum)
Anesthesia Evaluation  Patient identified by MRN, date of birth, ID band  Reviewed: Allergy & Precautions, NPO status , Patient's Chart, lab work & pertinent test results, reviewed documented beta blocker date and time   Airway Mallampati: II  TM Distance: >3 FB Neck ROM: Full    Dental no notable dental hx.    Pulmonary former smoker, PE   Pulmonary exam normal breath sounds clear to auscultation       Cardiovascular hypertension, + Peripheral Vascular Disease (L subclavian stenosis)  Normal cardiovascular exam Rhythm:Regular Rate:Normal     Neuro/Psych negative neurological ROS     GI/Hepatic Neg liver ROS, GERD  ,  Endo/Other  diabetes  Renal/GU negative Renal ROS  negative genitourinary   Musculoskeletal  (+) Arthritis ,   Abdominal (+) + obese,   Peds  Hematology negative hematology ROS (+)   Anesthesia Other Findings   Reproductive/Obstetrics                            Anesthesia Physical Anesthesia Plan  ASA: III  Anesthesia Plan: MAC   Post-op Pain Management:    Induction:   PONV Risk Score and Plan:   Airway Management Planned: Natural Airway  Additional Equipment: None  Intra-op Plan:   Post-operative Plan:   Informed Consent: I have reviewed the patients History and Physical, chart, labs and discussed the procedure including the risks, benefits and alternatives for the proposed anesthesia with the patient or authorized representative who has indicated his/her understanding and acceptance.     Plan Discussed with: CRNA, Anesthesiologist and Surgeon  Anesthesia Plan Comments:         Anesthesia Quick Evaluation

## 2017-09-18 NOTE — H&P (Signed)
The History and Physical notes are on paper, have been signed, and are to be scanned.   I have examined the patient and there are no changes to the H&P.   Willey Blade 09/18/2017 8:41 AM

## 2017-09-19 ENCOUNTER — Encounter: Payer: Self-pay | Admitting: Ophthalmology

## 2018-11-12 ENCOUNTER — Other Ambulatory Visit: Payer: Self-pay | Admitting: Physician Assistant

## 2018-11-12 DIAGNOSIS — R110 Nausea: Secondary | ICD-10-CM

## 2018-11-15 ENCOUNTER — Ambulatory Visit
Admission: RE | Admit: 2018-11-15 | Discharge: 2018-11-15 | Disposition: A | Discharge status: Home / Self Care | Payer: Medicare Other | Source: Ambulatory Visit | Attending: Physician Assistant | Admitting: Physician Assistant

## 2018-11-15 DIAGNOSIS — R110 Nausea: Secondary | ICD-10-CM | POA: Insufficient documentation

## 2018-11-15 DIAGNOSIS — Z9049 Acquired absence of other specified parts of digestive tract: Secondary | ICD-10-CM | POA: Diagnosis not present

## 2019-01-24 ENCOUNTER — Emergency Department
Admission: EM | Admit: 2019-01-24 | Discharge: 2019-01-24 | Disposition: A | Discharge status: Home / Self Care | Payer: Medicare Other | Attending: Emergency Medicine | Admitting: Emergency Medicine

## 2019-01-24 ENCOUNTER — Encounter: Payer: Self-pay | Admitting: Emergency Medicine

## 2019-01-24 ENCOUNTER — Emergency Department: Payer: Medicare Other

## 2019-01-24 ENCOUNTER — Other Ambulatory Visit: Payer: Self-pay

## 2019-01-24 DIAGNOSIS — I10 Essential (primary) hypertension: Secondary | ICD-10-CM | POA: Insufficient documentation

## 2019-01-24 DIAGNOSIS — E119 Type 2 diabetes mellitus without complications: Secondary | ICD-10-CM | POA: Diagnosis not present

## 2019-01-24 DIAGNOSIS — Z7982 Long term (current) use of aspirin: Secondary | ICD-10-CM | POA: Diagnosis not present

## 2019-01-24 DIAGNOSIS — Z794 Long term (current) use of insulin: Secondary | ICD-10-CM | POA: Insufficient documentation

## 2019-01-24 DIAGNOSIS — R1084 Generalized abdominal pain: Secondary | ICD-10-CM | POA: Insufficient documentation

## 2019-01-24 DIAGNOSIS — Z79899 Other long term (current) drug therapy: Secondary | ICD-10-CM | POA: Insufficient documentation

## 2019-01-24 DIAGNOSIS — Z87891 Personal history of nicotine dependence: Secondary | ICD-10-CM | POA: Insufficient documentation

## 2019-01-24 DIAGNOSIS — R39198 Other difficulties with micturition: Secondary | ICD-10-CM | POA: Insufficient documentation

## 2019-01-24 DIAGNOSIS — K5900 Constipation, unspecified: Secondary | ICD-10-CM | POA: Diagnosis not present

## 2019-01-24 DIAGNOSIS — R1030 Lower abdominal pain, unspecified: Secondary | ICD-10-CM | POA: Diagnosis present

## 2019-01-24 LAB — COMPREHENSIVE METABOLIC PANEL
ALT: 20 U/L (ref 0–44)
AST: 23 U/L (ref 15–41)
Albumin: 3.8 g/dL (ref 3.5–5.0)
Alkaline Phosphatase: 65 U/L (ref 38–126)
Anion gap: 10 (ref 5–15)
BILIRUBIN TOTAL: 1 mg/dL (ref 0.3–1.2)
BUN: 29 mg/dL — AB (ref 8–23)
CALCIUM: 9.3 mg/dL (ref 8.9–10.3)
CO2: 24 mmol/L (ref 22–32)
CREATININE: 1.41 mg/dL — AB (ref 0.44–1.00)
Chloride: 104 mmol/L (ref 98–111)
GFR, EST AFRICAN AMERICAN: 38 mL/min — AB (ref >60)
GFR, EST NON AFRICAN AMERICAN: 32 mL/min — AB (ref >60)
Glucose, Bld: 231 mg/dL — ABNORMAL HIGH (ref 70–99)
Potassium: 3.8 mmol/L (ref 3.5–5.1)
Sodium: 138 mmol/L (ref 135–145)
TOTAL PROTEIN: 6.8 g/dL (ref 6.5–8.1)

## 2019-01-24 LAB — URINALYSIS, COMPLETE (UACMP) WITH MICROSCOPIC
Bilirubin Urine: NEGATIVE
Glucose, UA: 50 mg/dL — AB
Ketones, ur: NEGATIVE mg/dL
NITRITE: NEGATIVE
PH: 5 (ref 5.0–8.0)
PROTEIN: NEGATIVE mg/dL
SPECIFIC GRAVITY, URINE: 1.015 (ref 1.005–1.030)

## 2019-01-24 LAB — CBC
HEMATOCRIT: 38.3 % (ref 36.0–46.0)
Hemoglobin: 13.2 g/dL (ref 12.0–15.0)
MCH: 28.7 pg (ref 26.0–34.0)
MCHC: 34.5 g/dL (ref 30.0–36.0)
MCV: 83.3 fL (ref 80.0–100.0)
NRBC: 0 % (ref 0.0–0.2)
PLATELETS: 182 10*3/uL (ref 150–400)
RBC: 4.6 MIL/uL (ref 3.87–5.11)
RDW: 13 % (ref 11.5–15.5)
WBC: 7.9 10*3/uL (ref 4.0–10.5)

## 2019-01-24 LAB — LIPASE, BLOOD: Lipase: 90 U/L — ABNORMAL HIGH (ref 11–51)

## 2019-01-24 MED ORDER — POLYETHYLENE GLYCOL 3350 17 GM/SCOOP PO POWD: ORAL | 0 refills | Status: DC

## 2019-01-24 MED ORDER — SODIUM CHLORIDE 0.9 % IV BOLUS
1000.0000 mL | Freq: Once | INTRAVENOUS | Status: AC
  Administered 2019-01-24: 1000 mL via INTRAVENOUS

## 2019-01-24 MED ORDER — MAGNESIUM CITRATE PO SOLN
0.5000 | Freq: Once | ORAL | Status: AC
  Administered 2019-01-24: 0.5 via ORAL
  Filled 2019-01-24: qty 296

## 2019-01-24 NOTE — ED Triage Notes (Signed)
Pt in via POV, states, "I think I am impacted, I have been having problems with this."  Pt reports lower abdominal pain, denies any N/V/D.  Last BM Tuesday.  Reports taking stool softener and Mirilax without any relief.  NAD noted at this time.

## 2019-01-24 NOTE — ED Notes (Signed)
Patient transported to CT 

## 2019-01-24 NOTE — ED Provider Notes (Signed)
[facility] Emergency Department Provider Note  ____________________________________________  Time seen: Approximately 3:35 PM  I have reviewed the triage vital signs and the nursing notes.   HISTORY  Chief Complaint Abdominal Pain    HPI Jillian Moore is a 85 y.o. female who complains of lower abdominal pain for the past 2 days, waxing and waning, worse with trying to sit and have a bowel movement.  No alleviating factors.  Nonradiating.  Moderate intensity, aching.  Feels like a fecal impaction which she has had in the past, not relieved by taking laxatives at home.  She also complains of "kidneys not working" because she has not urinated since 10:30 AM today despite having tried multiple times.  Denies ever having a problem like this before, denies any history of kidney disease.  Reports that she has been eating and drinking normally despite her abdominal issues.  Reports that last bowel movement was 3 days ago.  Prior abdominal surgical history includes hysterectomy and cholecystectomy.  No history of hernias.      Past Medical History:  Diagnosis Date  . Arthritis    left knee  . Diabetes mellitus without complication (HCC)    Patient takes Metformin and Insulin  . GERD (gastroesophageal reflux disease)   . Hypertension   . Pulmonary emboli (HCC) 12/2016  . Wears dentures    full upper, partial lower     Patient Active Problem List   Diagnosis Date Noted  . Subclavian arterial stenosis (HCC) 04/16/2017  . Essential hypertension 04/16/2017  . Acute pulmonary embolism (HCC) 04/16/2017  . Diabetes (HCC) 04/16/2017     Past Surgical History:  Procedure Laterality Date  . ABDOMINAL HYSTERECTOMY    . CATARACT EXTRACTION W/PHACO Right 09/18/2017   Procedure: CATARACT EXTRACTION PHACO AND INTRAOCULAR LENS PLACEMENT (IOC)  DIABETIC RIGHT;  Surgeon: Nevada Crane, MD;  Location: [facility];  Service: Ophthalmology;  Laterality:  Right;  Diabetic - insulin and oral meds  . CHOLECYSTECTOMY       Prior to Admission medications   Medication Sig Start Date End Date Taking? Authorizing Provider  acetaminophen (TYLENOL) 650 MG CR tablet Take 650 mg by mouth every 8 (eight) hours as needed for pain.   Yes [provider]  ALPRAZolam (XANAX) 0.25 MG tablet TAKE ONE TABLET BY MOUTH EVERY DAY AS NEEDED FOR ANXIETY 03/13/17  Yes [provider]  amLODipine (NORVASC) 5 MG tablet Take 5 mg by mouth daily.  04/12/17  Yes [provider]  aspirin EC 81 MG tablet Take 81 mg by mouth daily.   Yes [provider]  docusate sodium (DOK) 100 MG capsule Take 100 mg by mouth 2 (two) times daily. 11/04/18  Yes [provider]  Dulaglutide 1.5 MG/0.5ML SOPN Inject 1.5 mg into the skin every 7 (seven) days. 01/07/19  Yes [provider]  gabapentin (NEURONTIN) 100 MG capsule Take 100 mg by mouth at bedtime.  03/26/17  Yes [provider]  GNP EASY TOUCH GLUCOSE TEST test strip 2 (two) times daily.  01/29/17  Yes [provider]  lansoprazole (PREVACID) 30 MG capsule Take 30 mg by mouth daily at 12 noon.   Yes [provider]  LANTUS SOLOSTAR 100 UNIT/ML Solostar Pen Inject 20 Units into the skin at bedtime.  04/12/17  Yes [provider]  LINZESS 145 MCG CAPS capsule Take 145 mcg by mouth daily. 11/21/18  Yes [provider]  losartan-hydrochlorothiazide (HYZAAR) 100-25 MG tablet Take 1  tablet by mouth daily. 09/09/18  Yes [provider]  metFORMIN (GLUCOPHAGE) 1000 MG tablet Take 1,000 mg by mouth daily.  03/26/17  Yes [provider]  metoprolol tartrate (LOPRESSOR) 50 MG tablet Take 50 mg by mouth daily. 01/20/17  Yes [provider]  ondansetron (ZOFRAN) 4 MG tablet Take 4 mg by mouth every 8 (eight) hours as needed for nausea. 01/07/19  Yes [provider]  potassium chloride (MICRO-K) 10 MEQ CR capsule Take 10 mEq by  mouth daily.    Yes [provider]  rosuvastatin (CRESTOR) 10 MG tablet Take 10 mg by mouth daily.   Yes [provider]  TRADJENTA 5 MG TABS tablet  04/12/17  Yes [provider]  polyethylene glycol powder (GLYCOLAX/MIRALAX) powder 1 cap full in a full glass of water, two times a day for 3 days. 01/24/19   Sharman Cheek, MD     Allergies Meloxicam and Penicillins   Family History  Problem Relation Age of Onset  . Dementia Mother   . Diabetes Father   . Cancer Paternal Aunt     Social History Social History   Tobacco Use  . Smoking status: Former Smoker    Types: Cigarettes    Last attempt to quit: 1970    Years since quitting: 50.1  . Smokeless tobacco: Never Used  Substance Use Topics  . Alcohol use: No  . Drug use: No    Review of Systems  Constitutional:   No fever or chills.  ENT:   No sore throat. No rhinorrhea. Cardiovascular:   No chest pain or syncope. Respiratory:   No dyspnea or cough. Gastrointestinal:   Positive as above for lower abdominal pain and constipation. Musculoskeletal:   Negative for focal pain or swelling All other systems reviewed and are negative except as documented above in ROS and HPI.  ____________________________________________   PHYSICAL EXAM:  VITAL SIGNS: ED Triage Vitals  Enc Vitals Group     BP 01/24/19 1122 (!) 163/94     Pulse Rate 01/24/19 1122 (!) 109     Resp 01/24/19 1122 16     Temp 01/24/19 1122 97.6 F (36.4 C)     Temp Source 01/24/19 1122 Oral     SpO2 01/24/19 1122 100 %     Weight 01/24/19 1123 160 lb (72.6 kg)     Height 01/24/19 1123 5' (1.524 m)     Head Circumference --      Peak Flow --      Pain Score 01/24/19 1123 9     Pain Loc --      Pain Edu? --      Excl. in GC? --     Vital signs reviewed, nursing assessments reviewed.   Constitutional:   Alert and oriented. Non-toxic appearance. Eyes:   Conjunctivae are normal. EOMI. PERRL. ENT      Head:    Normocephalic and atraumatic.      Nose:   No congestion/rhinnorhea.       Mouth/Throat:   Dry mucous membranes, no pharyngeal erythema. No peritonsillar mass.       Neck:   No meningismus. Full ROM. Hematological/Lymphatic/Immunilogical:   No cervical lymphadenopathy. Cardiovascular:   Tachycardia heart rate 110. Symmetric bilateral radial and DP pulses.  No murmurs. Cap refill less than 2 seconds. Respiratory:   Normal respiratory effort without tachypnea/retractions. Breath sounds are clear and equal bilaterally. No wheezes/rales/rhonchi. Gastrointestinal:   Soft with diffuse lower abdominal tenderness. Non distended. There  is no CVA tenderness.  No rebound, rigidity, or guarding.  Rectal exam shows large amount of stool in the vault with hard stool.  No palpable mass  Musculoskeletal:   Normal range of motion in all extremities. No joint effusions.  No lower extremity tenderness.  No edema. Neurologic:   Normal speech and language.  Motor grossly intact. No acute focal neurologic deficits are appreciated.  Skin:    Skin is warm, dry and intact. No rash noted.  No petechiae, purpura, or bullae.  ____________________________________________    LABS (pertinent positives/negatives) (all labs ordered are listed, but only abnormal results are displayed) Labs Reviewed  LIPASE, BLOOD - Abnormal; Notable for the following components:      Result Value   Lipase 90 (*)    All other components within normal limits  COMPREHENSIVE METABOLIC PANEL - Abnormal; Notable for the following components:   Glucose, Bld 231 (*)    BUN 29 (*)    Creatinine, Ser 1.41 (*)    GFR calc non Af Amer 32 (*)    GFR calc Af Amer 38 (*)    All other components within normal limits  URINALYSIS, COMPLETE (UACMP) WITH MICROSCOPIC - Abnormal; Notable for the following components:   Color, Urine YELLOW (*)    APPearance CLEAR (*)    Glucose, UA 50 (*)    Hgb urine dipstick SMALL (*)    Leukocytes,Ua MODERATE (*)     Bacteria, UA RARE (*)    All other components within normal limits  URINE CULTURE  CBC   ____________________________________________   EKG    ____________________________________________    RADIOLOGY  Ct Abdomen Pelvis Wo Contrast  Result Date: 01/24/2019 CLINICAL DATA:  Abdominal pain, bowel obstruction, diverticulitis, history of impaction EXAM: CT ABDOMEN AND PELVIS WITHOUT CONTRAST TECHNIQUE: Multidetector CT imaging of the abdomen and pelvis was performed following the standard protocol without IV contrast. Sagittal and coronal MPR images reconstructed from axial data set. Oral contrast was not administered. COMPARISON:  10/15/2015 FINDINGS: Lower chest: Minimal scarring at medial RIGHT lung base. Remaining visualized lung bases clear Hepatobiliary: Gallbladder surgically absent.  Liver unremarkable. Pancreas: Normal appearance Spleen: Normal appearance Adrenals/Urinary Tract: Adrenal glands normal appearance. Kidneys, ureters, and bladder normal appearance Stomach/Bowel: Appendix not definitely visualized but no pericecal inflammatory process is seen. Increased stool in rectum. Scattered stool throughout colon. Diverticulosis of descending and sigmoid colon without wall thickening or pericolic inflammatory changes to suggest acute diverticulitis. Stomach decompressed. Small bowel loops normal appearance. Vascular/Lymphatic: Atherosclerotic calcifications of coronary arteries, aorta, iliac arteries and origins of abdominal visceral branches. Aorta normal caliber. No adenopathy. Scattered pelvic phleboliths. Reproductive: Uterus surgically absent. Nonvisualization of LEFT ovary. Unremarkable RIGHT ovary. Other: No free air or free fluid. Small umbilical hernia containing fat. Musculoskeletal: Bones demineralized. No acute osseous findings. Bulging discs L4-L5 and L5-S1. IMPRESSION: Increased stool in colon and rectum. Distal colonic diverticulosis without evidence of diverticulitis. Small  umbilical hernia containing fat. No acute intra-abdominal or intrapelvic abnormalities. Electronically Signed   By: Ulyses SouthwardMark  Boles M.D.   On: 01/24/2019 16:15    ____________________________________________   PROCEDURES Fecal disimpaction Date/Time: 01/24/2019 6:39 PM Performed by: Sharman CheekStafford, Hiromi Knodel, MD Authorized by: Sharman CheekStafford, Toniann Dickerson, MD  Consent: Verbal consent obtained. Risks and benefits: risks, benefits and alternatives were discussed Consent given by: patient Patient tolerance: Patient tolerated the procedure well with no immediate complications Comments: Small amount of hard stool removed, large amount of soft stool remaining in the rectal vault.     ____________________________________________  DIFFERENTIAL DIAGNOSIS   Constipation/fecal impaction, bowel obstruction, diverticulitis, dehydration  CLINICAL IMPRESSION / ASSESSMENT AND PLAN / ED COURSE  Pertinent labs & imaging results that were available during my care of the patient were reviewed by me and considered in my medical decision making (see chart for details).      Clinical Course as of Jan 25 1904  Fri Jan 24, 2019  1523 Patient presents with gradual onset of diffuse lower abdominal pain, waxing and waning, worse when she tries to have a bowel movement.  Last bowel movement was 3 days ago.  No vomiting, normal oral intake.  Also reports decreased urinary output.  Labs show slightly increased creatinine compared to baseline but not significantly so, consistent with dehydration.  Mild hyperglycemia consistent with diabetes.  Slightly elevated lipase.  CBC normal.  She is tachycardic.  We will give IV fluids for hydration.  Plan to perform rectal exam when she can be moved into a treatment room out of the hallway.  May need CT scan to evaluate for bowel obstruction.   [PS]    Clinical Course User Index [PS] Sharman Cheek, MD     ----------------------------------------- 6:40 PM on  01/24/2019 -----------------------------------------  Disimpaction performed.  We will give the patient magnesium citrate in hopes of facilitating a good bowel movement to allow her to feel better.  Work-up is unremarkable.  Urinalysis is equivocal, patient denies any urinary symptoms or history of UTIs.  I will send a urine culture and defer on antibiotics at this time.  ----------------------------------------- 7:05 PM on 01/24/2019 -----------------------------------------  Patient had a good bowel movement, feels better.  Plan to discharge with short course of MiraLAX, follow-up with primary care.  Counseled that if urine culture is positive we will call her to initiate antibiotics.  ____________________________________________   FINAL CLINICAL IMPRESSION(S) / ED DIAGNOSES    Final diagnoses:  Constipation, unspecified constipation type  Generalized abdominal pain     ED Discharge Orders         Ordered    polyethylene glycol powder (GLYCOLAX/MIRALAX) powder     01/24/19 1904          Portions of this note were generated with dragon dictation software. Dictation errors may occur despite best attempts at proofreading.   Sharman Cheek, MD 01/24/19 1907

## 2019-01-26 LAB — URINE CULTURE

## 2019-02-15 ENCOUNTER — Other Ambulatory Visit: Payer: Self-pay

## 2019-02-15 ENCOUNTER — Emergency Department: Payer: Medicare Other

## 2019-02-15 ENCOUNTER — Encounter: Payer: Self-pay | Admitting: Emergency Medicine

## 2019-02-15 ENCOUNTER — Emergency Department
Admission: EM | Admit: 2019-02-15 | Discharge: 2019-02-15 | Disposition: A | Discharge status: Home / Self Care | Payer: Medicare Other | Attending: Emergency Medicine | Admitting: Emergency Medicine

## 2019-02-15 DIAGNOSIS — K5900 Constipation, unspecified: Secondary | ICD-10-CM

## 2019-02-15 DIAGNOSIS — Z79899 Other long term (current) drug therapy: Secondary | ICD-10-CM | POA: Diagnosis not present

## 2019-02-15 DIAGNOSIS — K5641 Fecal impaction: Secondary | ICD-10-CM | POA: Diagnosis not present

## 2019-02-15 DIAGNOSIS — Z7982 Long term (current) use of aspirin: Secondary | ICD-10-CM | POA: Insufficient documentation

## 2019-02-15 DIAGNOSIS — I10 Essential (primary) hypertension: Secondary | ICD-10-CM | POA: Insufficient documentation

## 2019-02-15 DIAGNOSIS — Z794 Long term (current) use of insulin: Secondary | ICD-10-CM | POA: Diagnosis not present

## 2019-02-15 DIAGNOSIS — E119 Type 2 diabetes mellitus without complications: Secondary | ICD-10-CM | POA: Insufficient documentation

## 2019-02-15 DIAGNOSIS — Z87891 Personal history of nicotine dependence: Secondary | ICD-10-CM | POA: Diagnosis not present

## 2019-02-15 MED ORDER — MAGNESIUM CITRATE PO SOLN: 0.5000 | Freq: Every day | ORAL | 0 refills | Status: AC | PRN

## 2019-02-15 MED ORDER — MIDAZOLAM HCL 2 MG/2ML IJ SOLN
2.0000 mg | Freq: Once | INTRAMUSCULAR | Status: AC
  Administered 2019-02-15: 2 mg via INTRAMUSCULAR
  Filled 2019-02-15: qty 2

## 2019-02-15 MED ORDER — LIDOCAINE HCL URETHRAL/MUCOSAL 2 % EX GEL
1.0000 "application " | Freq: Once | CUTANEOUS | Status: AC
  Administered 2019-02-15: 1 via TOPICAL
  Filled 2019-02-15: qty 10

## 2019-02-15 MED ORDER — LIDOCAINE HCL 4 % EX SOLN
Freq: Once | CUTANEOUS | Status: DC
  Filled 2019-02-15: qty 50

## 2019-02-15 NOTE — ED Triage Notes (Signed)
Patient presents to the ED from [facility].  Patient was sent over to the ED because patient's xray showed stool impaction.  Patient was seen in the ED for the same approx. 3 weeks ago.  Patient states her last good bowel movement was Monday.  Patient is complaining of abdominal discomfort.

## 2019-02-15 NOTE — ED Provider Notes (Signed)
[facility] Emergency Department Provider Note ____________________________________________   First MD Initiated Contact with Patient 02/15/19 1421     (approximate)  I have reviewed the triage vital signs and the nursing notes.   HISTORY  Chief Complaint Fecal Impaction    HPI Jillian Moore is a 85 y.o. female with PMH as noted below who presents with constipation, chronic but acutely worsened over the last few days, with her last "good" bowel movement being 5 days ago.  The patient reports pressure in her rectum and some occasional abdominal cramping, but no sustained abdominal pain and no nausea or vomiting.  She has felt similarly before when she has required disimpaction.  The constipation has not been relieved by senna at home.  Past Medical History:  Diagnosis Date  . Arthritis    left knee  . Diabetes mellitus without complication (HCC)    Patient takes Metformin and Insulin  . GERD (gastroesophageal reflux disease)   . Hypertension   . Pulmonary emboli (HCC) 12/2016  . Wears dentures    full upper, partial lower    Patient Active Problem List   Diagnosis Date Noted  . Subclavian arterial stenosis (HCC) 04/16/2017  . Essential hypertension 04/16/2017  . Acute pulmonary embolism (HCC) 04/16/2017  . Diabetes (HCC) 04/16/2017    Past Surgical History:  Procedure Laterality Date  . ABDOMINAL HYSTERECTOMY    . CATARACT EXTRACTION W/PHACO Right 09/18/2017   Procedure: CATARACT EXTRACTION PHACO AND INTRAOCULAR LENS PLACEMENT (IOC)  DIABETIC RIGHT;  Surgeon: Nevada Crane, MD;  Location: [facility];  Service: Ophthalmology;  Laterality: Right;  Diabetic - insulin and oral meds  . CHOLECYSTECTOMY      Prior to Admission medications   Medication Sig Start Date End Date Taking? Authorizing Provider  acetaminophen (TYLENOL) 650 MG CR tablet Take 650 mg by mouth every 8 (eight) hours as needed for pain.    [provider]    ALPRAZolam (XANAX) 0.25 MG tablet TAKE ONE TABLET BY MOUTH EVERY DAY AS NEEDED FOR ANXIETY 03/13/17   [provider]  amLODipine (NORVASC) 5 MG tablet Take 5 mg by mouth daily.  04/12/17   [provider]  aspirin EC 81 MG tablet Take 81 mg by mouth daily.    [provider]  docusate sodium (DOK) 100 MG capsule Take 100 mg by mouth 2 (two) times daily. 11/04/18   [provider]  Dulaglutide 1.5 MG/0.5ML SOPN Inject 1.5 mg into the skin every 7 (seven) days. 01/07/19   [provider]  gabapentin (NEURONTIN) 100 MG capsule Take 100 mg by mouth at bedtime.  03/26/17   [provider]  GNP EASY TOUCH GLUCOSE TEST test strip 2 (two) times daily.  01/29/17   [provider]  lansoprazole (PREVACID) 30 MG capsule Take 30 mg by mouth daily at 12 noon.    [provider]  LANTUS SOLOSTAR 100 UNIT/ML Solostar Pen Inject 20 Units into the skin at bedtime.  04/12/17   [provider]  LINZESS 145 MCG CAPS capsule Take 145 mcg by mouth daily. 11/21/18   [provider]  losartan-hydrochlorothiazide (HYZAAR) 50-12.5 MG tablet Take 2 tablets by mouth daily. 01/25/19   [provider]  magnesium citrate SOLN Take 148 mLs (0.5 Bottles total) by mouth daily as needed for up to 2 days for severe constipation. 02/15/19 02/17/19  Dionne Bucy, MD  metFORMIN (GLUCOPHAGE) 1000 MG tablet Take 1,000 mg by mouth daily.  03/26/17  [provider]  metoprolol tartrate (LOPRESSOR) 50 MG tablet Take 50 mg by mouth daily. 01/20/17   [provider]  ondansetron (ZOFRAN) 4 MG tablet Take 4 mg by mouth every 8 (eight) hours as needed for nausea. 01/07/19   [provider]  pantoprazole (PROTONIX) 40 MG tablet Take 40 mg by mouth daily. 01/25/19   [provider]  polyethylene glycol powder (GLYCOLAX/MIRALAX) powder 1 cap full in a full glass of water, two times a day for 3 days. 01/24/19   Sharman Cheek, MD  potassium chloride (MICRO-K) 10 MEQ CR capsule Take 10 mEq by mouth daily.     [provider]  rosuvastatin (CRESTOR) 10 MG tablet Take 10 mg by mouth daily.    [provider]  senna (SENNA-TIME) 8.6 MG tablet Take 2 tablets by mouth daily.    [provider]  TRADJENTA 5 MG TABS tablet  04/12/17   [provider]    Allergies Meloxicam and Penicillins  Family History  Problem Relation Age of Onset  . Dementia Mother   . Diabetes Father   . Cancer Paternal Aunt     Social History Social History   Tobacco Use  . Smoking status: Former Smoker    Types: Cigarettes    Last attempt to quit: 1970    Years since quitting: 50.2  . Smokeless tobacco: Never Used  Substance Use Topics  . Alcohol use: No  . Drug use: No    Review of Systems  Constitutional: No fever. Respiratory: Denies shortness of breath. Gastrointestinal: No nausea or vomiting.  Genitourinary: Negative for flank pain.  Musculoskeletal: Negative for back pain. Skin: Negative for rash.   ____________________________________________   PHYSICAL EXAM:  VITAL SIGNS: ED Triage Vitals  Enc Vitals Group     BP 02/15/19 1130 (!) 182/153     Pulse Rate 02/15/19 1130 (!) 108     Resp --      Temp 02/15/19 1130 98.1 F (36.7 C)     Temp Source 02/15/19 1130 Oral     SpO2 02/15/19 1130 97 %     Weight --      Height --      Head Circumference --      Peak Flow --      Pain Score 02/15/19 1127 8     Pain Loc --      Pain Edu? --      Excl. in GC? --     Constitutional: Alert and oriented. Well appearing for age and in no acute distress. Eyes: Conjunctivae are normal.  Head: Atraumatic. Nose: No congestion/rhinnorhea. Mouth/Throat: Mucous membranes are moist.   Neck: Normal range of motion.  Cardiovascular: Good peripheral circulation. Respiratory: Normal respiratory effort.   Gastrointestinal: Soft and nontender. No distention.  Moderate amount of  stool palpated on DRE. Musculoskeletal: Extremities warm and well perfused.  Neurologic:  Normal speech and language. No gross focal neurologic deficits are appreciated.  Skin:  Skin is warm and dry. No rash noted. Psychiatric: Mood and affect are normal. Speech and behavior are normal.  ____________________________________________   LABS (all labs ordered are listed, but only abnormal results are displayed)  Labs Reviewed - No data to display ____________________________________________  EKG   ____________________________________________  RADIOLOGY  XR abdomen: Moderate stool burden with no evidence of obstruction ____________________________________________   PROCEDURES  Procedure(s) performed: No  Fecal disimpaction Date/Time: 02/15/2019 3:27 PM Performed by: Dionne Bucy, MD Authorized by: Dionne Bucy, MD  Consent: Verbal consent obtained. Risks and benefits: risks, benefits and alternatives were discussed Consent given by: patient Patient understanding: patient states understanding of the procedure being performed Patient identity confirmed: verbally with patient Local anesthesia used: yes  Anesthesia: Local anesthesia used: yes Local Anesthetic: topical anesthetic  Sedation: Patient sedated: no  Patient tolerance: Patient tolerated the procedure well with no immediate complications Comments: Indication: Constipation with fecal stool impaction.   Patient was given 2 mg medazepam IM for anxiolysis.      Critical Care performed: No ____________________________________________   INITIAL IMPRESSION / ASSESSMENT AND PLAN / ED COURSE  Pertinent labs & imaging results that were available during my care of the patient were reviewed by me and considered in my medical decision making (see chart for details).  85 year old female presents with constipation and concern for fecal impaction.  She has no abdominal distention or vomiting, or any  symptoms concerning for bowel obstruction.  X-ray was obtained and confirms stool burden with no evidence of obstruction.  On exam, the patient is extremely well-appearing for her age.  She was hypertensive and mildly tachycardic at triage although I suspect this is due to discomfort.  On DRE, the patient had a palpable mass of stool although it is not exceptionally hard.  I performed manual disimpaction after giving a low-dose of midazolam for anxiolysis and applying topical lidocaine anesthetic.  I was able to get a moderate amount of stool.  The patient tolerated the procedure well.  At this time, the patient is stable for discharge home.  I discussed the plan of care with the patient and her daughter.  We will give a prescription for magnesium citrate.  I gave thorough return precautions and they expressed understanding. ____________________________________________   FINAL CLINICAL IMPRESSION(S) / ED DIAGNOSES  Final diagnoses:  Constipation      NEW MEDICATIONS STARTED DURING THIS VISIT:  New Prescriptions   MAGNESIUM CITRATE SOLN    Take 148 mLs (0.5 Bottles total) by mouth daily as needed for up to 2 days for severe constipation.     Note:  This document was prepared using Dragon voice recognition software and may include unintentional dictation errors.     Dionne Bucy, MD 02/15/19 [phone]

## 2019-02-15 NOTE — Discharge Instructions (Signed)
Take a half bottle of magnesium citrate today, and repeat tomorrow if needed.  Return to the ER for new, worsening, persistent constipation, blood in the stool, abdominal pain, vomiting, or any other new or worsening symptoms that concern you.

## 2019-03-27 ENCOUNTER — Other Ambulatory Visit: Payer: Self-pay

## 2019-03-27 ENCOUNTER — Ambulatory Visit
Admission: RE | Admit: 2019-03-27 | Discharge: 2019-03-27 | Disposition: A | Discharge status: Home / Self Care | Payer: Medicare Other | Source: Ambulatory Visit | Attending: Student | Admitting: Student

## 2019-03-27 ENCOUNTER — Other Ambulatory Visit
Admission: RE | Admit: 2019-03-27 | Discharge: 2019-03-27 | Disposition: A | Discharge status: Home / Self Care | Payer: Medicare Other | Source: Ambulatory Visit | Attending: Student | Admitting: Student

## 2019-03-27 ENCOUNTER — Other Ambulatory Visit: Payer: Self-pay | Admitting: Student

## 2019-03-27 DIAGNOSIS — R197 Diarrhea, unspecified: Secondary | ICD-10-CM | POA: Insufficient documentation

## 2019-03-27 DIAGNOSIS — R110 Nausea: Secondary | ICD-10-CM | POA: Diagnosis present

## 2019-03-27 DIAGNOSIS — R1084 Generalized abdominal pain: Secondary | ICD-10-CM

## 2019-03-27 LAB — GASTROINTESTINAL PANEL BY PCR, STOOL (REPLACES STOOL CULTURE)

## 2019-03-27 LAB — C DIFFICILE QUICK SCREEN W PCR REFLEX
C Diff antigen: NEGATIVE
C Diff interpretation: NOT DETECTED
C Diff toxin: NEGATIVE

## 2019-03-27 MED ORDER — IOHEXOL 300 MG/ML  SOLN
80.0000 mL | Freq: Once | INTRAMUSCULAR | Status: AC | PRN
  Administered 2019-03-27: 80 mL via INTRAVENOUS

## 2019-07-03 ENCOUNTER — Other Ambulatory Visit: Payer: Self-pay | Admitting: Student

## 2019-07-03 DIAGNOSIS — R110 Nausea: Secondary | ICD-10-CM

## 2019-07-16 ENCOUNTER — Ambulatory Visit
Admission: RE | Admit: 2019-07-16 | Discharge: 2019-07-16 | Disposition: A | Discharge status: Home / Self Care | Payer: Medicare Other | Source: Ambulatory Visit | Attending: Student | Admitting: Student

## 2019-07-16 ENCOUNTER — Other Ambulatory Visit: Payer: Self-pay

## 2019-07-16 DIAGNOSIS — R110 Nausea: Secondary | ICD-10-CM | POA: Insufficient documentation

## 2019-08-08 ENCOUNTER — Other Ambulatory Visit: Payer: Self-pay | Admitting: Student

## 2019-08-08 DIAGNOSIS — R110 Nausea: Secondary | ICD-10-CM

## 2019-08-15 ENCOUNTER — Other Ambulatory Visit: Payer: Self-pay

## 2019-08-15 ENCOUNTER — Encounter
Admission: RE | Admit: 2019-08-15 | Discharge: 2019-08-15 | Disposition: A | Discharge status: Home / Self Care | Payer: Medicare Other | Source: Ambulatory Visit | Attending: Student | Admitting: Student

## 2019-08-15 DIAGNOSIS — R110 Nausea: Secondary | ICD-10-CM | POA: Diagnosis not present

## 2019-08-15 MED ORDER — TECHNETIUM TC 99M SULFUR COLLOID
2.0000 | Freq: Once | INTRAVENOUS | Status: AC | PRN
  Administered 2019-08-15: 08:00:00 2.25 via INTRAVENOUS

## 2019-09-23 ENCOUNTER — Emergency Department
Admission: EM | Admit: 2019-09-23 | Discharge: 2019-09-23 | Disposition: A | Discharge status: Home / Self Care | Payer: Medicare Other | Attending: Emergency Medicine | Admitting: Emergency Medicine

## 2019-09-23 ENCOUNTER — Other Ambulatory Visit: Payer: Self-pay

## 2019-09-23 ENCOUNTER — Encounter: Payer: Self-pay | Admitting: Emergency Medicine

## 2019-09-23 DIAGNOSIS — X58XXXA Exposure to other specified factors, initial encounter: Secondary | ICD-10-CM | POA: Insufficient documentation

## 2019-09-23 DIAGNOSIS — Y929 Unspecified place or not applicable: Secondary | ICD-10-CM | POA: Diagnosis not present

## 2019-09-23 DIAGNOSIS — S39012A Strain of muscle, fascia and tendon of lower back, initial encounter: Secondary | ICD-10-CM | POA: Diagnosis not present

## 2019-09-23 DIAGNOSIS — I10 Essential (primary) hypertension: Secondary | ICD-10-CM | POA: Diagnosis not present

## 2019-09-23 DIAGNOSIS — Y939 Activity, unspecified: Secondary | ICD-10-CM | POA: Diagnosis not present

## 2019-09-23 DIAGNOSIS — Z79899 Other long term (current) drug therapy: Secondary | ICD-10-CM | POA: Insufficient documentation

## 2019-09-23 DIAGNOSIS — Z87891 Personal history of nicotine dependence: Secondary | ICD-10-CM | POA: Diagnosis not present

## 2019-09-23 DIAGNOSIS — E119 Type 2 diabetes mellitus without complications: Secondary | ICD-10-CM | POA: Diagnosis not present

## 2019-09-23 DIAGNOSIS — Z7984 Long term (current) use of oral hypoglycemic drugs: Secondary | ICD-10-CM | POA: Diagnosis not present

## 2019-09-23 DIAGNOSIS — S3992XA Unspecified injury of lower back, initial encounter: Secondary | ICD-10-CM | POA: Diagnosis present

## 2019-09-23 DIAGNOSIS — Y999 Unspecified external cause status: Secondary | ICD-10-CM | POA: Diagnosis not present

## 2019-09-23 MED ORDER — LIDOCAINE 5 % EX PTCH: 1.0000 | MEDICATED_PATCH | Freq: Two times a day (BID) | CUTANEOUS | 0 refills | Status: AC

## 2019-09-23 MED ORDER — CYCLOBENZAPRINE HCL 5 MG PO TABS: 5.0000 mg | ORAL_TABLET | Freq: Two times a day (BID) | ORAL | 0 refills | Status: DC

## 2019-09-23 MED ORDER — CYCLOBENZAPRINE HCL 10 MG PO TABS
5.0000 mg | ORAL_TABLET | Freq: Once | ORAL | Status: AC
  Administered 2019-09-23: 11:00:00 5 mg via ORAL
  Filled 2019-09-23: qty 1

## 2019-09-23 MED ORDER — LIDOCAINE 5 % EX PTCH
1.0000 | MEDICATED_PATCH | CUTANEOUS | Status: DC
  Administered 2019-09-23: 1 via TRANSDERMAL
  Filled 2019-09-23: qty 1

## 2019-09-23 NOTE — ED Triage Notes (Signed)
Pt in via POV, reports lower back pain since moving furniture last week.  Being seen by Urgent Care without any relief.  NAD noted at this time.

## 2019-09-23 NOTE — ED Notes (Signed)
See triage note  Presents with right lower back pain  States she thinks she moved some furniture and developed pain  Was seen on Saturday at [place] she was dx'd with muscle strain

## 2019-09-23 NOTE — ED Provider Notes (Signed)
[facility] Emergency Department Provider Note   ____________________________________________   First MD Initiated Contact with Patient 09/23/19 1029     (approximate)  I have reviewed the triage vital signs and the nursing notes.   HISTORY  Chief Complaint Back Pain    HPI Jillian Moore is a 85 y.o. female patient complain low back pain secondary to moving furniture last week.  Patient was seen by urgent care clinic and given Norco without any relief by the urgent care clinic 2 days ago.  Patient state x-rays shows no acute findings as explained by her PCP.  Patient states medicine makes her sleepy.  Patient states there is a mild radicular component to the left lower extremity.  Patient denies bladder bowel dysfunction.  Patient states pain increased with movements.  Patient described the pain as "spasmatic".  Patient rates the pain as a 10/10.         Past Medical History:  Diagnosis Date  . Arthritis    left knee  . Diabetes mellitus without complication (HCC)    Patient takes Metformin and Insulin  . GERD (gastroesophageal reflux disease)   . Hypertension   . Pulmonary emboli (HCC) 12/2016  . Wears dentures    full upper, partial lower    Patient Active Problem List   Diagnosis Date Noted  . Subclavian arterial stenosis (HCC) 04/16/2017  . Essential hypertension 04/16/2017  . Acute pulmonary embolism (HCC) 04/16/2017  . Diabetes (HCC) 04/16/2017    Past Surgical History:  Procedure Laterality Date  . ABDOMINAL HYSTERECTOMY    . CATARACT EXTRACTION W/PHACO Right 09/18/2017   Procedure: CATARACT EXTRACTION PHACO AND INTRAOCULAR LENS PLACEMENT (IOC)  DIABETIC RIGHT;  Surgeon: [place] CraneKing, Bradley Mark, MD;  [facility];  Service: Ophthalmology;  Laterality: Right;  Diabetic - insulin and oral meds  . CHOLECYSTECTOMY      Prior to Admission medications   Medication Sig Start Date End Date Taking? Authorizing Provider   acetaminophen (TYLENOL) 650 MG CR tablet Take 650 mg by mouth every 8 (eight) hours as needed for pain.    [provider]  ALPRAZolam (XANAX) 0.25 MG tablet TAKE ONE TABLET BY MOUTH EVERY DAY AS NEEDED FOR ANXIETY 03/13/17   [provider]  amLODipine (NORVASC) 5 MG tablet Take 5 mg by mouth daily.  04/12/17   [provider]  aspirin EC 81 MG tablet Take 81 mg by mouth daily.    [provider]  cyclobenzaprine (FLEXERIL) 5 MG tablet Take 1 tablet (5 mg total) by mouth 2 (two) times daily. 09/23/19   Joni ReiningSmith, Kathe Wirick K, PA-C  docusate sodium (DOK) 100 MG capsule Take 100 mg by mouth 2 (two) times daily. 11/04/18   [provider]  Dulaglutide 1.5 MG/0.5ML SOPN Inject 1.5 mg into the skin every 7 (seven) days. 01/07/19   [provider]  gabapentin (NEURONTIN) 100 MG capsule Take 100 mg by mouth at bedtime.  03/26/17   [provider]  GNP EASY TOUCH GLUCOSE TEST test strip 2 (two) times daily.  01/29/17   [provider]  lansoprazole (PREVACID) 30 MG capsule Take 30 mg by mouth daily at 12 noon.    [provider]  LANTUS SOLOSTAR 100 UNIT/ML Solostar Pen Inject 20 Units into the skin at bedtime.  04/12/17   [provider]  lidocaine (LIDODERM) 5 % Place 1 patch onto the skin every 12 (twelve) hours. Remove & Discard patch within 12 hours or as  directed by MD 09/23/19 09/22/20  Joni Reining, PA-C  LINZESS 145 MCG CAPS capsule Take 145 mcg by mouth daily. 11/21/18   [provider]  losartan-hydrochlorothiazide (HYZAAR) 50-12.5 MG tablet Take 2 tablets by mouth daily. 01/25/19   [provider]  metFORMIN (GLUCOPHAGE) 1000 MG tablet Take 1,000 mg by mouth daily.  03/26/17   [provider]  metoprolol tartrate (LOPRESSOR) 50 MG tablet Take 50 mg by mouth daily. 01/20/17   [provider]  ondansetron (ZOFRAN) 4 MG tablet Take 4 mg by mouth every 8 (eight) hours as needed for nausea.  01/07/19   [provider]  pantoprazole (PROTONIX) 40 MG tablet Take 40 mg by mouth daily. 01/25/19   [provider]  polyethylene glycol powder (GLYCOLAX/MIRALAX) powder 1 cap full in a full glass of water, two times a day for 3 days. 01/24/19   Sharman Cheek, MD  potassium chloride (MICRO-K) 10 MEQ CR capsule Take 10 mEq by mouth daily.     [provider]  rosuvastatin (CRESTOR) 10 MG tablet Take 10 mg by mouth daily.    [provider]  senna (SENNA-TIME) 8.6 MG tablet Take 2 tablets by mouth daily.    [provider]  TRADJENTA 5 MG TABS tablet  04/12/17   [provider]    Allergies Meloxicam and Penicillins  Family History  Problem Relation Age of Onset  . Dementia Mother   . Diabetes Father   . Cancer Paternal Aunt     Social History Social History   Tobacco Use  . Smoking status: Former Smoker    Types: Cigarettes    Quit date: 1970    Years since quitting: 50.8  . Smokeless tobacco: Never Used  Substance Use Topics  . Alcohol use: No  . Drug use: No    Review of Systems  Constitutional: No fever/chills Eyes: No visual changes. ENT: No sore throat. Cardiovascular: Denies chest pain. Respiratory: Denies shortness of breath. Gastrointestinal: No abdominal pain.  No nausea, no vomiting.  No diarrhea.  No constipation. Genitourinary: Negative for dysuria. Musculoskeletal: Positive for back pain. Skin: Negative for rash. Neurological: Negative for headaches, focal weakness or numbness. Endocrine:  Diabetes and hypertension.  ____________________________________________   PHYSICAL EXAM:  VITAL SIGNS: ED Triage Vitals  Enc Vitals Group     BP 09/23/19 1035 112/78     Pulse Rate 09/23/19 1035 90     Resp 09/23/19 1035 18     Temp 09/23/19 1035 98 F (36.7 C)     Temp Source 09/23/19 1035 Oral     SpO2 09/23/19 1035 98 %     Weight 09/23/19 1011 162 lb (73.5 kg)     Height 09/23/19 1011 5' (1.524 m)      Head Circumference --      Peak Flow --      Pain Score 09/23/19 1011 10     Pain Loc --      Pain Edu? --      Excl. in GC? --    Constitutional: Alert and oriented. Well appearing and in no acute distress. Cardiovascular: Normal rate, regular rhythm. Grossly normal heart sounds.  Good peripheral circulation. Respiratory: Normal respiratory effort.  No retractions. Lungs CTAB. Gastrointestinal: Soft and nontender. No distention. No abdominal bruits. No CVA tenderness. Genitourinary: Deferred Musculoskeletal: No for spinal deformity.  Patient is moderate guarding palpation of L3-S1.  Patient decreased range of motion is all fields.  Patient have right paraspinal muscle  spasm with left lateral movements.  No lower extremity tenderness nor edema.  Negative straight leg test.  No joint effusions. Neurologic:  Normal speech and language. No gross focal neurologic deficits are appreciated. No gait instability. Skin:  Skin is warm, dry and intact. No rash noted. Psychiatric: Mood and affect are normal. Speech and behavior are normal.  ____________________________________________   LABS (all labs ordered are listed, but only abnormal results are displayed)  Labs Reviewed - No data to display ____________________________________________  EKG   ____________________________________________  RADIOLOGY  ED MD interpretation:    Official radiology report(s): No results found.  ____________________________________________   PROCEDURES  Procedure(s) performed (including Critical Care):  Procedures   ____________________________________________   INITIAL IMPRESSION / ASSESSMENT AND PLAN / ED COURSE  As part of my medical decision making, I reviewed the following data within the [place] was evaluated in Emergency Department on 09/23/2019 for the symptoms described in the history of present illness. She was evaluated in the  context of the global COVID-19 pandemic, which necessitated consideration that the patient might be at risk for infection with the SARS-CoV-2 virus that causes COVID-19. Institutional protocols and algorithms that pertain to the evaluation of patients at risk for COVID-19 are in a state of rapid change based on information released by regulatory bodies including the CDC and federal and state organizations. These policies and algorithms were followed during the patient's care in the ED.  Patient presents with continued back pain secondary to moving furniture last week.  Physical exam is consistent with muscle strain.  Patient given a Lidoderm patch and 5 mg of Flexeril.  Patient advised to discontinue Norco.  Follow-up PCP in 2 to 3 days if no improvement.      ____________________________________________   FINAL CLINICAL IMPRESSION(S) / ED DIAGNOSES  Final diagnoses:  Strain of lumbar region, initial encounter     ED Discharge Orders         Ordered    cyclobenzaprine (FLEXERIL) 5 MG tablet  2 times daily     09/23/19 1057    lidocaine (LIDODERM) 5 %  Every 12 hours     09/23/19 1057           Note:  This document was prepared using Dragon voice recognition software and may include unintentional dictation errors.    Sable Feil, PA-C 09/23/19 1058    Arta Silence, MD 09/23/19 1314

## 2020-01-02 ENCOUNTER — Other Ambulatory Visit: Payer: Self-pay

## 2020-01-02 ENCOUNTER — Ambulatory Visit: Payer: Medicare Other | Attending: Internal Medicine

## 2020-01-02 DIAGNOSIS — Z23 Encounter for immunization: Secondary | ICD-10-CM | POA: Insufficient documentation

## 2020-01-02 NOTE — Progress Notes (Signed)
   Covid-19 Vaccination Clinic  Name:  Jillian Moore    MRN: [id number] DOB: September 01, 1926  01/02/2020  [name] was observed post Covid-19 immunization for 15 minutes without incidence. She was provided with Vaccine Information Sheet and instruction to access the V-Safe system.   [name] was instructed to call 911 with any severe reactions post vaccine: Marland Kitchen Difficulty breathing  . Swelling of your face and throat  . A fast heartbeat  . A bad rash all over your body  . Dizziness and weakness    Immunizations Administered    Name Date Dose VIS Date Route   Pfizer COVID-19 Vaccine 01/02/2020 10:18 AM 0.3 mL 11/21/2019 Intramuscular   Manufacturer: [employer], [employer]   Lot: [id number]   NDC: [address]-5

## 2020-01-13 ENCOUNTER — Other Ambulatory Visit: Payer: Self-pay | Admitting: Student

## 2020-01-13 DIAGNOSIS — R110 Nausea: Secondary | ICD-10-CM

## 2020-01-13 DIAGNOSIS — R42 Dizziness and giddiness: Secondary | ICD-10-CM

## 2020-01-20 ENCOUNTER — Ambulatory Visit: Payer: Medicare Other | Attending: Internal Medicine

## 2020-01-20 DIAGNOSIS — Z23 Encounter for immunization: Secondary | ICD-10-CM | POA: Insufficient documentation

## 2020-01-20 NOTE — Progress Notes (Signed)
   Covid-19 Vaccination Clinic  Name:  Jillian Moore    MRN: [id number] DOB: 06-Mar-1926  01/20/2020  [name] was observed post Covid-19 immunization for 15 minutes without incidence. She was provided with Vaccine Information Sheet and instruction to access the V-Safe system.   [name] was instructed to call 911 with any severe reactions post vaccine: Marland Kitchen Difficulty breathing  . Swelling of your face and throat  . A fast heartbeat  . A bad rash all over your body  . Dizziness and weakness    Immunizations Administered    Name Date Dose VIS Date Route   Pfizer COVID-19 Vaccine 01/20/2020  9:53 AM 0.3 mL 11/21/2019 Intramuscular   Manufacturer: [employer], [employer]   Lot: [id number]   NDC: [address]-1

## 2020-01-23 ENCOUNTER — Ambulatory Visit
Admission: RE | Admit: 2020-01-23 | Discharge: 2020-01-23 | Disposition: A | Discharge status: Home / Self Care | Payer: Medicare Other | Source: Ambulatory Visit | Attending: Student | Admitting: Student

## 2020-01-23 ENCOUNTER — Other Ambulatory Visit: Payer: Self-pay

## 2020-01-23 DIAGNOSIS — R110 Nausea: Secondary | ICD-10-CM | POA: Diagnosis present

## 2020-01-23 DIAGNOSIS — R42 Dizziness and giddiness: Secondary | ICD-10-CM | POA: Diagnosis present

## 2020-01-23 LAB — POCT I-STAT CREATININE: Creatinine, Ser: 1.1 mg/dL — ABNORMAL HIGH (ref 0.44–1.00)

## 2020-01-23 MED ORDER — IOHEXOL 300 MG/ML  SOLN
75.0000 mL | Freq: Once | INTRAMUSCULAR | Status: AC | PRN
  Administered 2020-01-23: 75 mL via INTRAVENOUS

## 2020-03-06 IMAGING — CR UPPER GI W/ SMALL BOWEL
1 of 2 series · 8 of 24 positions shown · non-contrast
Comparison: CT 03/27/2019.

CLINICAL DATA: Nausea.

EXAM:
UPPER GI SERIES WITH SMALL BOWEL FOLLOW-THROUGH
FLUOROSCOPY TIME:  Fluoroscopy Time: 1 minutes 48 seconds
Radiation Exposure Index (if provided by the fluoroscopic device):
134.4 mGy
TECHNIQUE: Combined double contrast and single contrast upper GI series using
effervescent crystals, thick barium, and thin barium. Subsequently,
serial images of the small bowel were obtained including spot views
of the terminal ileum.

[Series 1: t abdomen supine · 0.14mm/px · 8 of 11 slices shown]
[im 1/11]
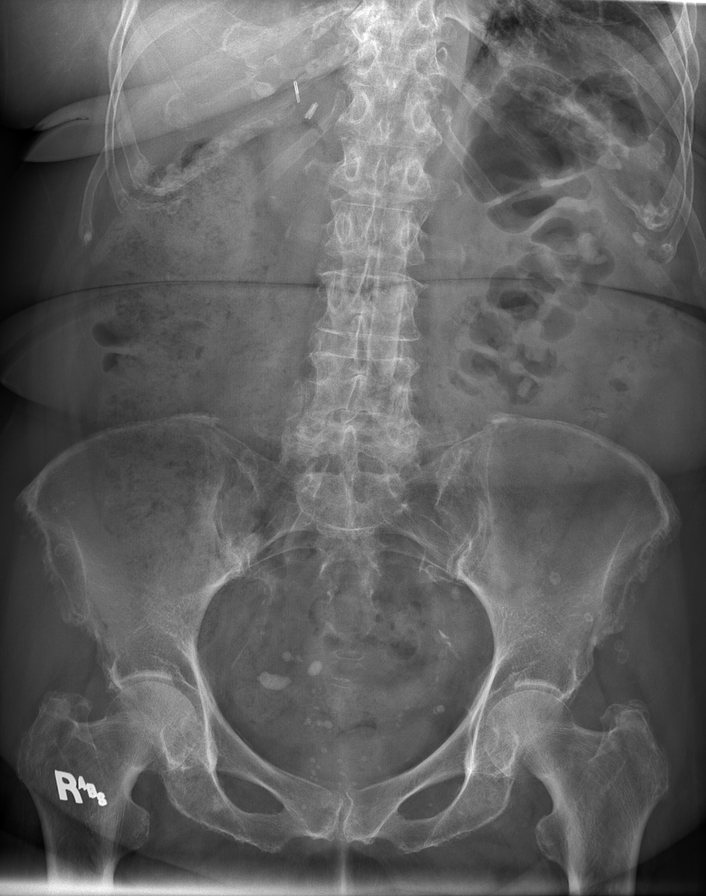
[im 2/11]
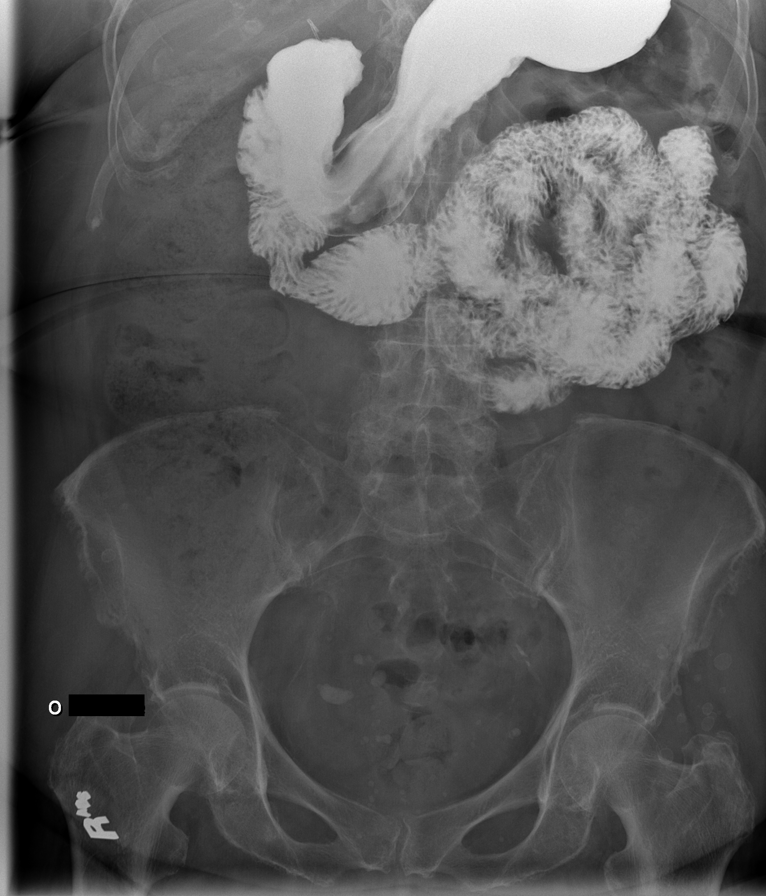
[im 3/11]
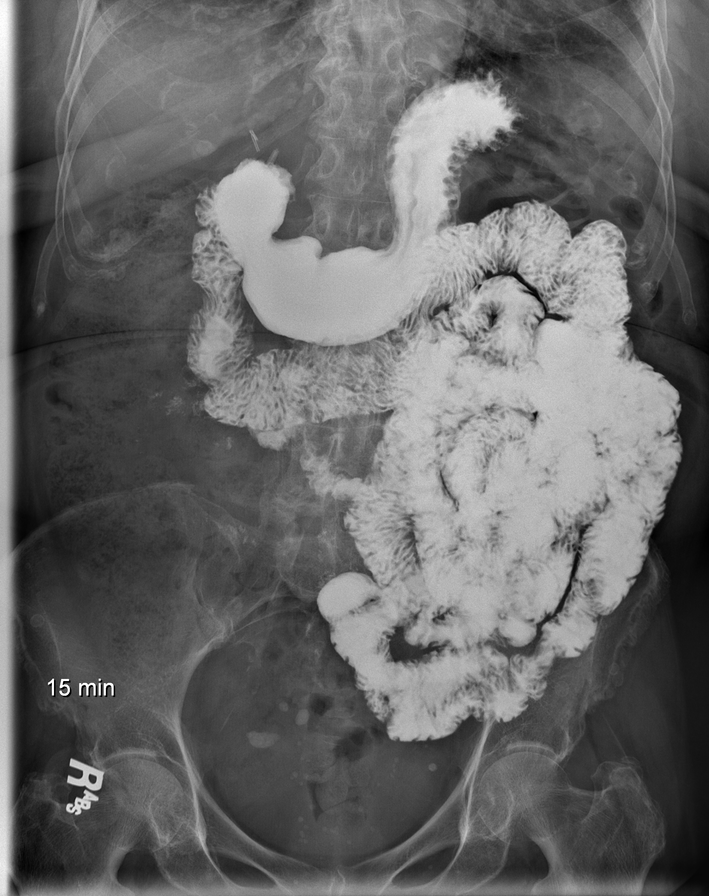
[im 4/11]
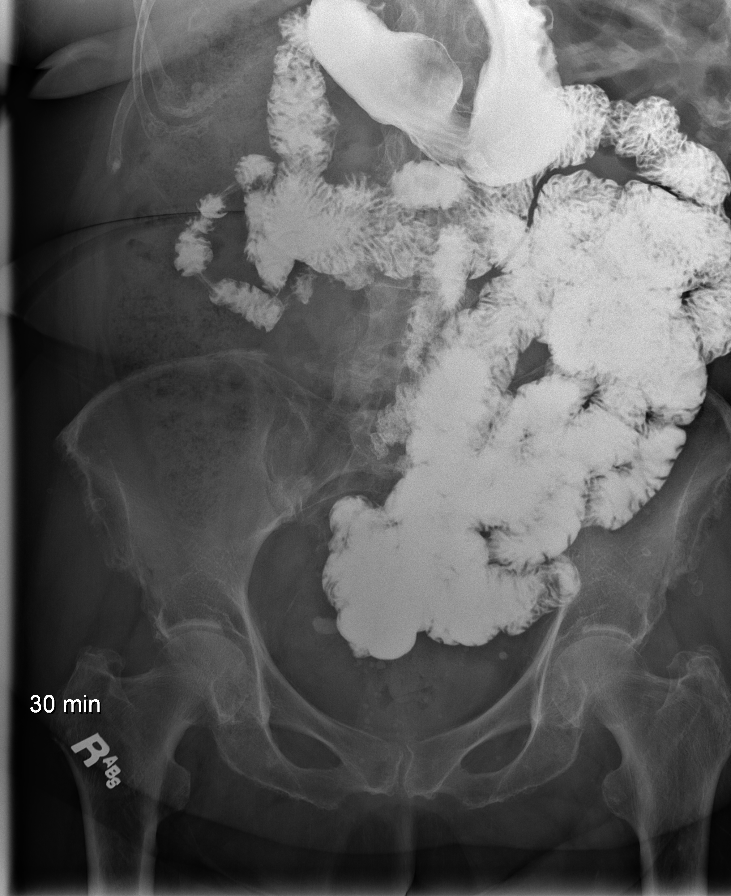
[im 5/11]
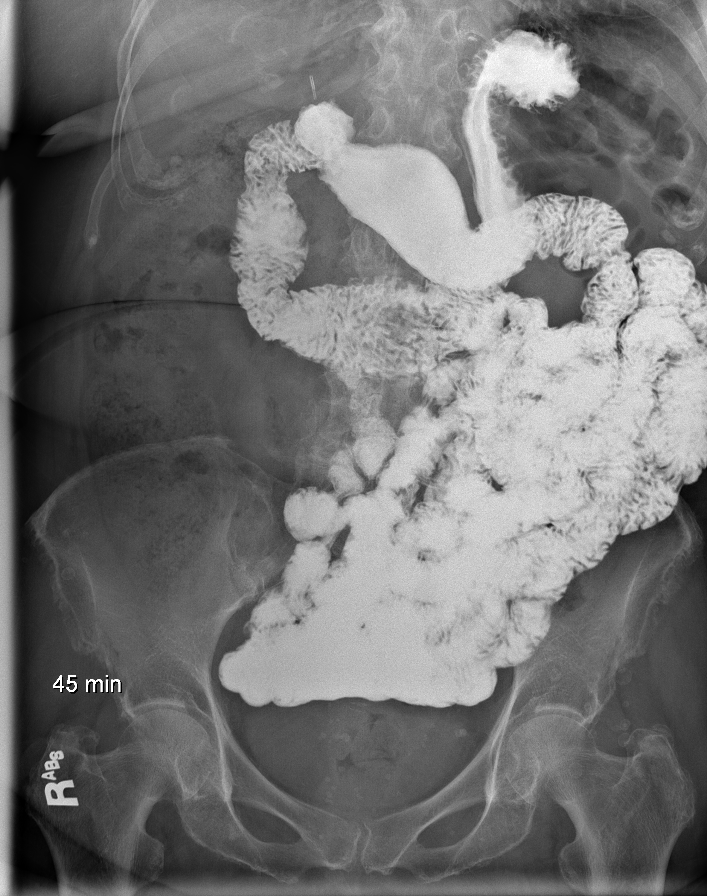
[im 6/11]
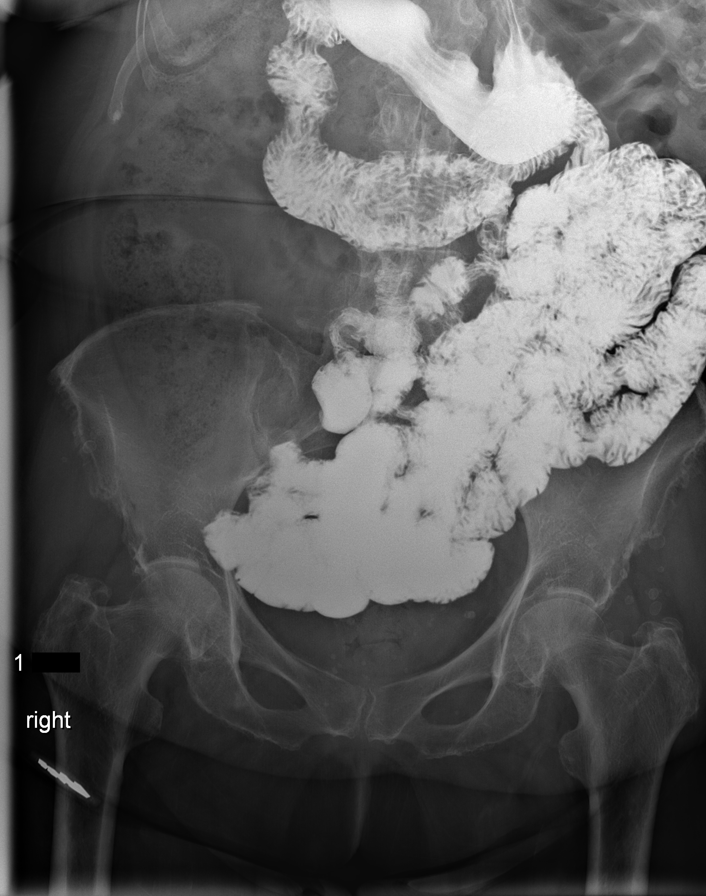
[im 7/11]
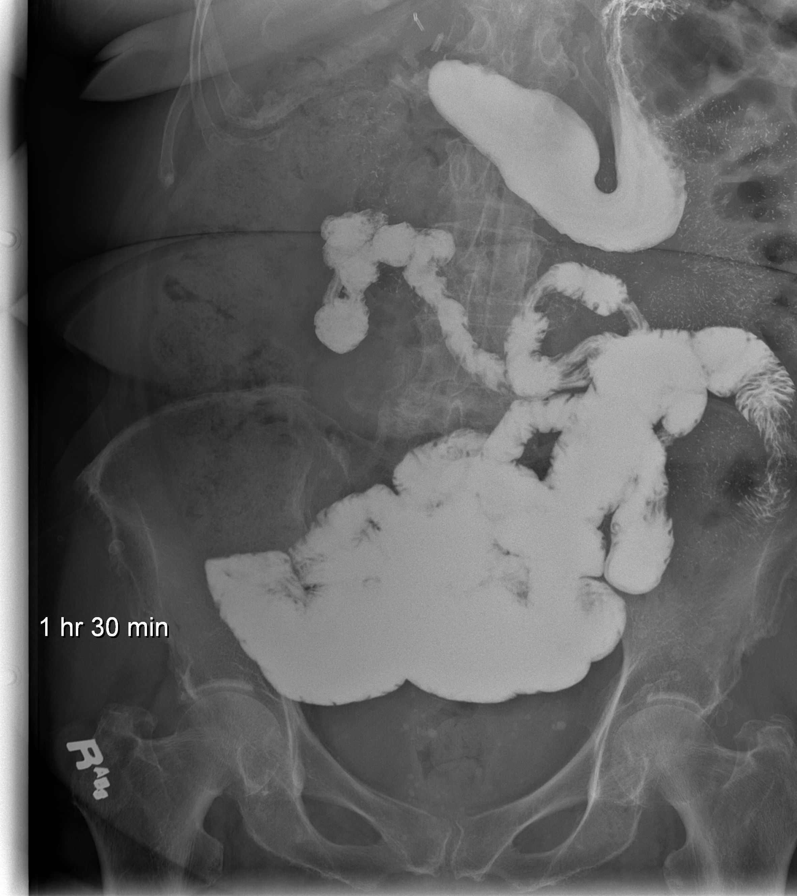
[im 8/11]
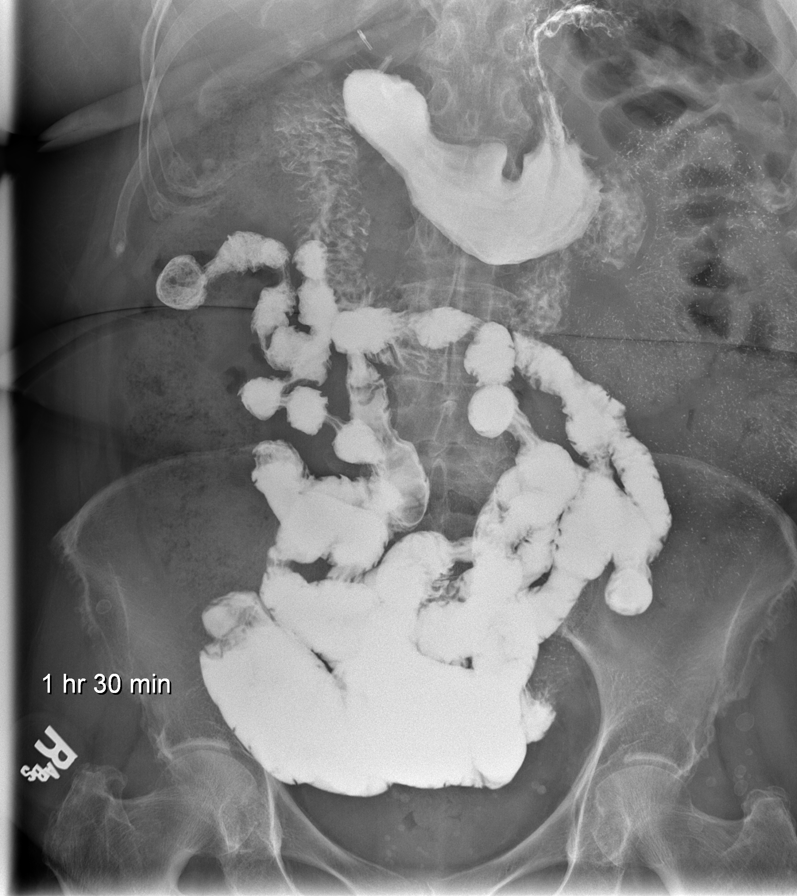

[8 of 24 positions shown; findings below may reference images not displayed]

FINDINGS: Thoracic esophagus is widely patent. Small sliding hiatal hernia. No
reflux. Stomach and duodenum appear normal. No evidence of mass or
ulceration. Small bowel full thickness and caliber normal. No focal
small bowel abnormalities identified. Terminal ileum appears normal.
IMPRESSION: 1. Small sliding hiatal hernia. No reflux. Upper GI wise
unremarkable. No focal obstructing lesions or mass lesions.

2. Small-bowel follow-through is normal. Terminal ileum appears
normal.

## 2020-06-01 ENCOUNTER — Other Ambulatory Visit: Payer: Self-pay | Admitting: Student

## 2020-06-01 DIAGNOSIS — K5909 Other constipation: Secondary | ICD-10-CM

## 2020-06-01 DIAGNOSIS — R1010 Upper abdominal pain, unspecified: Secondary | ICD-10-CM

## 2020-06-01 DIAGNOSIS — R110 Nausea: Secondary | ICD-10-CM

## 2020-06-01 DIAGNOSIS — R1032 Left lower quadrant pain: Secondary | ICD-10-CM

## 2020-06-11 ENCOUNTER — Ambulatory Visit
Admission: RE | Admit: 2020-06-11 | Discharge: 2020-06-11 | Disposition: A | Discharge status: Home / Self Care | Payer: Medicare Other | Source: Ambulatory Visit | Attending: Student | Admitting: Student

## 2020-06-11 ENCOUNTER — Other Ambulatory Visit: Payer: Self-pay

## 2020-06-11 DIAGNOSIS — K5909 Other constipation: Secondary | ICD-10-CM | POA: Diagnosis present

## 2020-06-11 DIAGNOSIS — R110 Nausea: Secondary | ICD-10-CM | POA: Insufficient documentation

## 2020-06-11 DIAGNOSIS — R1010 Upper abdominal pain, unspecified: Secondary | ICD-10-CM | POA: Diagnosis present

## 2020-06-11 DIAGNOSIS — R1032 Left lower quadrant pain: Secondary | ICD-10-CM | POA: Diagnosis present

## 2020-06-11 DIAGNOSIS — R1031 Right lower quadrant pain: Secondary | ICD-10-CM | POA: Diagnosis present

## 2020-06-11 LAB — POCT I-STAT CREATININE: Creatinine, Ser: 1.4 mg/dL — ABNORMAL HIGH (ref 0.44–1.00)

## 2020-06-11 MED ORDER — IOHEXOL 300 MG/ML  SOLN
100.0000 mL | Freq: Once | INTRAMUSCULAR | Status: AC | PRN
  Administered 2020-06-11: 80 mL via INTRAVENOUS

## 2021-06-08 ENCOUNTER — Other Ambulatory Visit: Payer: Self-pay

## 2021-06-08 ENCOUNTER — Emergency Department: Payer: Medicare Other

## 2021-06-08 DIAGNOSIS — I10 Essential (primary) hypertension: Secondary | ICD-10-CM | POA: Diagnosis not present

## 2021-06-08 DIAGNOSIS — Z7984 Long term (current) use of oral hypoglycemic drugs: Secondary | ICD-10-CM | POA: Diagnosis not present

## 2021-06-08 DIAGNOSIS — Z7982 Long term (current) use of aspirin: Secondary | ICD-10-CM | POA: Insufficient documentation

## 2021-06-08 DIAGNOSIS — E1165 Type 2 diabetes mellitus with hyperglycemia: Secondary | ICD-10-CM | POA: Insufficient documentation

## 2021-06-08 DIAGNOSIS — Z79899 Other long term (current) drug therapy: Secondary | ICD-10-CM | POA: Diagnosis not present

## 2021-06-08 DIAGNOSIS — Z794 Long term (current) use of insulin: Secondary | ICD-10-CM | POA: Diagnosis not present

## 2021-06-08 DIAGNOSIS — R202 Paresthesia of skin: Secondary | ICD-10-CM | POA: Insufficient documentation

## 2021-06-08 DIAGNOSIS — Z87891 Personal history of nicotine dependence: Secondary | ICD-10-CM | POA: Insufficient documentation

## 2021-06-08 DIAGNOSIS — R471 Dysarthria and anarthria: Secondary | ICD-10-CM | POA: Insufficient documentation

## 2021-06-08 LAB — CBC
HCT: 34 % — ABNORMAL LOW (ref 36.0–46.0)
Hemoglobin: 11.8 g/dL — ABNORMAL LOW (ref 12.0–15.0)
MCH: 28.8 pg (ref 26.0–34.0)
MCHC: 34.7 g/dL (ref 30.0–36.0)
MCV: 82.9 fL (ref 80.0–100.0)
Platelets: 201 10*3/uL (ref 150–400)
RBC: 4.1 MIL/uL (ref 3.87–5.11)
RDW: 13.7 % (ref 11.5–15.5)
WBC: 6.9 10*3/uL (ref 4.0–10.5)
nRBC: 0 % (ref 0.0–0.2)

## 2021-06-08 LAB — BASIC METABOLIC PANEL
Anion gap: 9 (ref 5–15)
BUN: 34 mg/dL — ABNORMAL HIGH (ref 8–23)
CO2: 22 mmol/L (ref 22–32)
Calcium: 9.2 mg/dL (ref 8.9–10.3)
Chloride: 100 mmol/L (ref 98–111)
Creatinine, Ser: 1.45 mg/dL — ABNORMAL HIGH (ref 0.44–1.00)
GFR, Estimated: 34 mL/min — ABNORMAL LOW (ref >60)
Glucose, Bld: 395 mg/dL — ABNORMAL HIGH (ref 70–99)
Potassium: 4.3 mmol/L (ref 3.5–5.1)
Sodium: 131 mmol/L — ABNORMAL LOW (ref 135–145)

## 2021-06-08 NOTE — ED Notes (Signed)
Per Dr. Scotty Court no order for troponin at this time.

## 2021-06-08 NOTE — ED Triage Notes (Signed)
Pt states for the past few days she has been nauseous and weak family states today pt seemed to have some difference in her speech. Family states that speech is at baseline now, pt denies headache or one sided weakness. She just feels nauseous at the moment, has rx for phenergan that she takes at home.

## 2021-06-09 ENCOUNTER — Emergency Department
Admission: EM | Admit: 2021-06-09 | Discharge: 2021-06-09 | Disposition: A | Discharge status: Home / Self Care | Payer: Medicare Other | Attending: Emergency Medicine | Admitting: Emergency Medicine

## 2021-06-09 ENCOUNTER — Emergency Department: Payer: Medicare Other

## 2021-06-09 DIAGNOSIS — R471 Dysarthria and anarthria: Secondary | ICD-10-CM

## 2021-06-09 DIAGNOSIS — R739 Hyperglycemia, unspecified: Secondary | ICD-10-CM

## 2021-06-09 LAB — URINALYSIS, COMPLETE (UACMP) WITH MICROSCOPIC
Bacteria, UA: NONE SEEN
Bilirubin Urine: NEGATIVE
Glucose, UA: >=500 mg/dL — AB
Hgb urine dipstick: NEGATIVE
Ketones, ur: NEGATIVE mg/dL
Leukocytes,Ua: NEGATIVE
Nitrite: NEGATIVE
Protein, ur: NEGATIVE mg/dL
Specific Gravity, Urine: 1.009 (ref 1.005–1.030)
pH: 5 (ref 5.0–8.0)

## 2021-06-09 LAB — CBG MONITORING, ED
Glucose-Capillary: 336 mg/dL — ABNORMAL HIGH (ref 70–99)
Glucose-Capillary: 302 mg/dL — ABNORMAL HIGH (ref 70–99)

## 2021-06-09 MED ORDER — SODIUM CHLORIDE 0.9 % IV BOLUS (SEPSIS)
500.0000 mL | Freq: Once | INTRAVENOUS | Status: AC
  Administered 2021-06-09: 500 mL via INTRAVENOUS

## 2021-06-09 NOTE — Discharge Instructions (Signed)
You were seen in the emergency department for intermittent episodes of slurred speech.  Your neurologic exam today was normal.  Labs showed no abnormality other than high blood sugar which has improved.  Urine showed no infection.  MRI of your brain showed no stroke.  I recommend close follow-up with your primary care doctor.

## 2021-06-09 NOTE — ED Provider Notes (Signed)
[facility] Emergency Department Provider Note  ____________________________________________   Event Date/Time   First MD Initiated Contact with Patient 06/09/21 0215     (approximate)  I have reviewed the triage vital signs and the nursing notes.   HISTORY  Chief Complaint Weakness    HPI Jillian Moore is a 87 y.o. female with history of hypertension, diabetes, PE who presents to the emergency department with her daughter with concerns of intermittent slurred speech over the past 2 days.  Patient denies noticing any slurred speech.  Daughter reports she feels her speech is normal now.  No aphasia.  Patient denies headache, head injury.  On aspirin but no other blood thinners.  Patient denies any numbness, tingling or weakness currently.  States occasionally she will have tingling in the fingertips of her left hand when she wakes up in the morning and this has been ongoing for several weeks.  No chest pain or shortness of breath.  Does have occasional nausea in the morning.  States she took a Phenergan for this today.  No vomiting, diarrhea.  No abdominal pain.  Daughter also concerned because blood sugar was in the 400s with EMS.  Patient is on metformin and insulin.        Past Medical History:  Diagnosis Date   Arthritis    left knee   Diabetes mellitus without complication (HCC)    Patient takes Metformin and Insulin   GERD (gastroesophageal reflux disease)    Hypertension    Pulmonary emboli (HCC) 12/2016   Wears dentures    full upper, partial lower    Patient Active Problem List   Diagnosis Date Noted   Subclavian arterial stenosis (HCC) 04/16/2017   Essential hypertension 04/16/2017   Acute pulmonary embolism (HCC) 04/16/2017   Diabetes (HCC) 04/16/2017    Past Surgical History:  Procedure Laterality Date   ABDOMINAL HYSTERECTOMY     CATARACT EXTRACTION W/PHACO Right 09/18/2017   Procedure: CATARACT EXTRACTION PHACO AND INTRAOCULAR  LENS PLACEMENT (IOC)  DIABETIC RIGHT;  Surgeon: Nevada Crane, MD;  Location: [facility];  Service: Ophthalmology;  Laterality: Right;  Diabetic - insulin and oral meds   CHOLECYSTECTOMY      Prior to Admission medications   Medication Sig Start Date End Date Taking? Authorizing Provider  acetaminophen (TYLENOL) 650 MG CR tablet Take 650 mg by mouth every 8 (eight) hours as needed for pain.    [provider]  ALPRAZolam (XANAX) 0.25 MG tablet TAKE ONE TABLET BY MOUTH EVERY DAY AS NEEDED FOR ANXIETY 03/13/17   [provider]  amLODipine (NORVASC) 5 MG tablet Take 5 mg by mouth daily.  04/12/17   [provider]  aspirin EC 81 MG tablet Take 81 mg by mouth daily.    [provider]  cyclobenzaprine (FLEXERIL) 5 MG tablet Take 1 tablet (5 mg total) by mouth 2 (two) times daily. 09/23/19   Joni Reining, PA-C  docusate sodium (DOK) 100 MG capsule Take 100 mg by mouth 2 (two) times daily. 11/04/18   [provider]  Dulaglutide 1.5 MG/0.5ML SOPN Inject 1.5 mg into the skin every 7 (seven) days. 01/07/19   [provider]  gabapentin (NEURONTIN) 100 MG capsule Take 100 mg by mouth at bedtime.  03/26/17   [provider]  GNP EASY TOUCH GLUCOSE TEST test strip 2 (two) times daily.  01/29/17   [provider]  lansoprazole (PREVACID) 30 MG capsule Take 30 mg by  mouth daily at 12 noon.    [provider]  LANTUS SOLOSTAR 100 UNIT/ML Solostar Pen Inject 20 Units into the skin at bedtime.  04/12/17   [provider]  LINZESS 145 MCG CAPS capsule Take 145 mcg by mouth daily. 11/21/18   [provider]  losartan-hydrochlorothiazide (HYZAAR) 50-12.5 MG tablet Take 2 tablets by mouth daily. 01/25/19   [provider]  metFORMIN (GLUCOPHAGE) 1000 MG tablet Take 1,000 mg by mouth daily.  03/26/17   [provider]  metoprolol tartrate (LOPRESSOR) 50 MG tablet Take 50 mg by mouth daily.  01/20/17   [provider]  ondansetron (ZOFRAN) 4 MG tablet Take 4 mg by mouth every 8 (eight) hours as needed for nausea. 01/07/19   [provider]  pantoprazole (PROTONIX) 40 MG tablet Take 40 mg by mouth daily. 01/25/19   [provider]  polyethylene glycol powder (GLYCOLAX/MIRALAX) powder 1 cap full in a full glass of water, two times a day for 3 days. 01/24/19   Sharman Cheek, MD  potassium chloride (MICRO-K) 10 MEQ CR capsule Take 10 mEq by mouth daily.     [provider]  rosuvastatin (CRESTOR) 10 MG tablet Take 10 mg by mouth daily.    [provider]  senna (SENNA-TIME) 8.6 MG tablet Take 2 tablets by mouth daily.    [provider]  TRADJENTA 5 MG TABS tablet  04/12/17   [provider]    Allergies Meloxicam and Penicillins  Family History  Problem Relation Age of Onset   Dementia Mother    Diabetes Father    Cancer Paternal Aunt     Social History Social History   Tobacco Use   Smoking status: Former    Pack years: 0.00    Types: Cigarettes    Quit date: 1970    Years since quitting: 52.5   Smokeless tobacco: Never  Vaping Use   Vaping Use: Never used  Substance Use Topics   Alcohol use: No   Drug use: No    Review of Systems Constitutional: No fever. Eyes: No visual changes. ENT: No sore throat. Cardiovascular: Denies chest pain. Respiratory: Denies shortness of breath. Gastrointestinal: No nausea, vomiting, diarrhea. Genitourinary: Negative for dysuria. Musculoskeletal: Negative for back pain. Skin: Negative for rash. Neurological: Negative for focal weakness or numbness.  ____________________________________________   PHYSICAL EXAM:  VITAL SIGNS: ED Triage Vitals  Enc Vitals Group     BP 06/08/21 1926 140/69     Pulse Rate 06/08/21 1926 92     Resp 06/08/21 1926 18     Temp 06/08/21 1926 98.2 F (36.8 C)     Temp Source 06/08/21 1926 Oral     SpO2 06/08/21 1926 99 %      Weight 06/08/21 1927 150 lb (68 kg)     Height 06/08/21 1927 5\' 1"  (1.549 m)     Head Circumference --      Peak Flow --      Pain Score 06/08/21 1926 0     Pain Loc --      Pain Edu? --      Excl. in GC? --    CONSTITUTIONAL: Alert and oriented and responds appropriately to questions. Well-appearing; well-nourished, appears younger than stated age HEAD: Normocephalic, atraumatic EYES: Conjunctivae clear, pupils appear equal, EOM appear intact ENT: normal nose; moist mucous membranes NECK: Supple, normal ROM CARD: RRR; S1 and S2 appreciated; no murmurs, no clicks, no rubs, no gallops RESP: Normal chest  excursion without splinting or tachypnea; breath sounds clear and equal bilaterally; no wheezes, no rhonchi, no rales, no hypoxia or respiratory distress, speaking full sentences ABD/GI: Normal bowel sounds; non-distended; soft, non-tender, no rebound, no guarding, no peritoneal signs, no hepatosplenomegaly BACK: The back appears normal EXT: Normal ROM in all joints; no deformity noted, minimal symmetric edema in distal bilateral lower extremities; no cyanosis SKIN: Normal color for age and race; warm; no rash on exposed skin NEURO: Moves all extremities equally, strength 5/5 in all 4 extremities, cranial nerves II through XII intact, normal speech without aphasia or dysarthria PSYCH: The patient's mood and manner are appropriate.  ____________________________________________   LABS (all labs ordered are listed, but only abnormal results are displayed)  Labs Reviewed  BASIC METABOLIC PANEL - Abnormal; Notable for the following components:      Result Value   Sodium 131 (*)    Glucose, Bld 395 (*)    BUN 34 (*)    Creatinine, Ser 1.45 (*)    GFR, Estimated 34 (*)    All other components within normal limits  CBC - Abnormal; Notable for the following components:   Hemoglobin 11.8 (*)    HCT 34.0 (*)    All other components within normal limits  URINALYSIS, COMPLETE (UACMP) WITH  MICROSCOPIC - Abnormal; Notable for the following components:   Color, Urine STRAW (*)    APPearance CLEAR (*)    Glucose, UA >=500 (*)    All other components within normal limits  CBG MONITORING, ED - Abnormal; Notable for the following components:   Glucose-Capillary 336 (*)    All other components within normal limits  CBG MONITORING, ED   ____________________________________________  EKG   EKG Interpretation  Date/Time:  Wednesday June 08 2021 19:29:02 EDT Ventricular Rate:  90 PR Interval:  158 QRS Duration: 72 QT Interval:  328 QTC Calculation: 401 R Axis:   26 Text Interpretation: Normal sinus rhythm Anterior infarct , age undetermined Abnormal ECG Confirmed by Rochele RaringWard, Temeca Somma [phone]) on 06/09/2021 3:01:30 AM         ____________________________________________  RADIOLOGY Normajean BaxterI, Samreen Seltzer, personally viewed and evaluated these images (plain radiographs) as part of my medical decision making, as well as reviewing the written report by the radiologist.  ED MD interpretation: CT head unremarkable.  Official radiology report(s): CT Head Wo Contrast  Result Date: 06/08/2021 CLINICAL DATA:  Nausea and altered mental status. EXAM: CT HEAD WITHOUT CONTRAST TECHNIQUE: Contiguous axial images were obtained from the base of the skull through the vertex without intravenous contrast. COMPARISON:  January 23, 2020 FINDINGS: Brain: There is mild cerebral atrophy with widening of the extra-axial spaces and ventricular dilatation. There are areas of decreased attenuation within the white matter tracts of the supratentorial brain, consistent with microvascular disease changes. Vascular: No hyperdense vessel or unexpected calcification. Skull: Normal. Negative for fracture or focal lesion. Sinuses/Orbits: No acute finding. Other: None. IMPRESSION: 1. Generalized cerebral atrophy. 2. No acute intracranial abnormality. Electronically Signed   By: Aram Candelahaddeus  Houston M.D.   On: 06/08/2021 21:18    MR BRAIN WO CONTRAST  Result Date: 06/09/2021 CLINICAL DATA:  Vision and speech disturbance EXAM: MRI HEAD WITHOUT CONTRAST TECHNIQUE: Multiplanar, multiecho pulse sequences of the brain and surrounding structures were obtained without intravenous contrast. COMPARISON:  None. FINDINGS: Brain: No acute infarct, mass effect or extra-axial collection. No acute or chronic hemorrhage. There is multifocal hyperintense T2-weighted signal within the white matter. Generalized volume loss without a clear lobar predilection. The  midline structures are normal. Vascular: Major flow voids are preserved. Skull and upper cervical spine: Normal calvarium and skull base. Visualized upper cervical spine and soft tissues are normal. Sinuses/Orbits:No paranasal sinus fluid levels or advanced mucosal thickening. No mastoid or middle ear effusion. There are bilateral lens replacements. IMPRESSION: 1. No acute intracranial abnormality. 2. Findings of chronic ischemic microangiopathy and generalized volume loss. Electronically Signed   By: Deatra Robinson M.D.   On: 06/09/2021 03:58    ____________________________________________   PROCEDURES  Procedure(s) performed (including Critical Care):  Procedures   ____________________________________________   INITIAL IMPRESSION / ASSESSMENT AND PLAN / ED COURSE  As part of my medical decision making, I reviewed the following data within the electronic MEDICAL RECORD NUMBER History obtained from family, Nursing notes reviewed and incorporated, Labs reviewed , EKG interpreted  Old EKG reviewed, Old chart reviewed, Radiograph reviewed , and Notes from prior ED visits         Patient here with her daughter for concerns for intermittent dysarthria.  Currently neurologically intact.  Labs from triage are reassuring.  CT head unremarkable.  We will proceed with MRI to rule out CVA.  Doubt intracranial hemorrhage.  Patient also has elevated glucose but no DKA.  Will recheck blood  sugar now.  Discussed with family that urinary tract infections can also cause similar symptoms.  Will obtain urinalysis.  ED PROGRESS  Patient's blood sugar is slowly improving.  Patient is not in DKA.  Urine shows no sign of infection.  MRI of the brain shows no acute abnormality.  No sign of CVA.  Given she is neurologically intact now without complaints, I feel she is safe for discharge home.  Recommended close follow-up with her primary care doctor.  Patient and daughter at bedside are comfortable with this plan.  Discussed return precautions.   At this time, I do not feel there is any life-threatening condition present. I have reviewed, interpreted and discussed all results (EKG, imaging, lab, urine as appropriate) and exam findings with patient/family. I have reviewed nursing notes and appropriate previous records.  I feel the patient is safe to be discharged home without further emergent workup and can continue workup as an outpatient as needed. Discussed usual and customary return precautions. Patient/family verbalize understanding and are comfortable with this plan.  Outpatient follow-up has been provided as needed. All questions have been answered.  ____________________________________________   FINAL CLINICAL IMPRESSION(S) / ED DIAGNOSES  Final diagnoses:  Dysarthria  Hyperglycemia     ED Discharge Orders     None       *Please note:  Jillian Moore was evaluated in Emergency Department on 06/09/2021 for the symptoms described in the history of present illness. She was evaluated in the context of the global COVID-19 pandemic, which necessitated consideration that the patient might be at risk for infection with the SARS-CoV-2 virus that causes COVID-19. Institutional protocols and algorithms that pertain to the evaluation of patients at risk for COVID-19 are in a state of rapid change based on information released by regulatory bodies including the CDC and federal and state  organizations. These policies and algorithms were followed during the patient's care in the ED.  Some ED evaluations and interventions may be delayed as a result of limited staffing during and the pandemic.*   Note:  This document was prepared using Dragon voice recognition software and may include unintentional dictation errors.    Raksha Wolfgang, Layla Maw, DO 06/09/21 [phone]

## 2021-11-11 ENCOUNTER — Emergency Department
Admission: EM | Admit: 2021-11-11 | Discharge: 2021-11-11 | Disposition: A | Discharge status: Home / Self Care | Payer: Medicare Other | Attending: Emergency Medicine | Admitting: Emergency Medicine

## 2021-11-11 ENCOUNTER — Other Ambulatory Visit: Payer: Self-pay

## 2021-11-11 DIAGNOSIS — Z87891 Personal history of nicotine dependence: Secondary | ICD-10-CM | POA: Insufficient documentation

## 2021-11-11 DIAGNOSIS — E860 Dehydration: Secondary | ICD-10-CM | POA: Insufficient documentation

## 2021-11-11 DIAGNOSIS — Z79899 Other long term (current) drug therapy: Secondary | ICD-10-CM | POA: Insufficient documentation

## 2021-11-11 DIAGNOSIS — I10 Essential (primary) hypertension: Secondary | ICD-10-CM | POA: Diagnosis not present

## 2021-11-11 DIAGNOSIS — Z7982 Long term (current) use of aspirin: Secondary | ICD-10-CM | POA: Diagnosis not present

## 2021-11-11 DIAGNOSIS — R531 Weakness: Secondary | ICD-10-CM

## 2021-11-11 DIAGNOSIS — R42 Dizziness and giddiness: Secondary | ICD-10-CM | POA: Diagnosis present

## 2021-11-11 DIAGNOSIS — E119 Type 2 diabetes mellitus without complications: Secondary | ICD-10-CM | POA: Diagnosis not present

## 2021-11-11 DIAGNOSIS — Z7984 Long term (current) use of oral hypoglycemic drugs: Secondary | ICD-10-CM | POA: Diagnosis not present

## 2021-11-11 DIAGNOSIS — Z794 Long term (current) use of insulin: Secondary | ICD-10-CM | POA: Diagnosis not present

## 2021-11-11 LAB — BASIC METABOLIC PANEL
Anion gap: 10 (ref 5–15)
BUN: 18 mg/dL (ref 8–23)
CO2: 26 mmol/L (ref 22–32)
Calcium: 9.2 mg/dL (ref 8.9–10.3)
Chloride: 100 mmol/L (ref 98–111)
Creatinine, Ser: 0.74 mg/dL (ref 0.44–1.00)
GFR, Estimated: >60 mL/min (ref >60)
Glucose, Bld: 326 mg/dL — ABNORMAL HIGH (ref 70–99)
Potassium: 3.6 mmol/L (ref 3.5–5.1)
Sodium: 136 mmol/L (ref 135–145)

## 2021-11-11 LAB — URINALYSIS, ROUTINE W REFLEX MICROSCOPIC
Bilirubin Urine: NEGATIVE
Glucose, UA: >=500 mg/dL — AB
Ketones, ur: 5 mg/dL — AB
Leukocytes,Ua: NEGATIVE
Nitrite: NEGATIVE
Protein, ur: NEGATIVE mg/dL
Specific Gravity, Urine: 1.022 (ref 1.005–1.030)
pH: 5 (ref 5.0–8.0)

## 2021-11-11 LAB — CBC
HCT: 35.3 % — ABNORMAL LOW (ref 36.0–46.0)
Hemoglobin: 12.1 g/dL (ref 12.0–15.0)
MCH: 28.6 pg (ref 26.0–34.0)
MCHC: 34.3 g/dL (ref 30.0–36.0)
MCV: 83.5 fL (ref 80.0–100.0)
Platelets: 203 10*3/uL (ref 150–400)
RBC: 4.23 MIL/uL (ref 3.87–5.11)
RDW: 14.5 % (ref 11.5–15.5)
WBC: 7 10*3/uL (ref 4.0–10.5)
nRBC: 0 % (ref 0.0–0.2)

## 2021-11-11 LAB — CBG MONITORING, ED: Glucose-Capillary: 270 mg/dL — ABNORMAL HIGH (ref 70–99)

## 2021-11-11 MED ORDER — LACTATED RINGERS IV BOLUS
1000.0000 mL | Freq: Once | INTRAVENOUS | Status: AC
  Administered 2021-11-11: 1000 mL via INTRAVENOUS

## 2021-11-11 MED ORDER — INSULIN ASPART 100 UNIT/ML IJ SOLN
6.0000 [IU] | Freq: Once | INTRAMUSCULAR | Status: AC
  Administered 2021-11-11: 6 [IU] via INTRAVENOUS
  Filled 2021-11-11: qty 1

## 2021-11-11 NOTE — ED Triage Notes (Signed)
Pt comes into the ED via EMS from home with near syncope while walking into the kitchen this morning, helped to the floor by home health nurse, pt is a/ox4 on arrival, ambulatory with cane with EMS with a steady gait.  153/84 96HR 98%RA [id number]

## 2021-11-11 NOTE — ED Provider Notes (Signed)
Emergency Medicine Provider Triage Evaluation Note  Jillian Moore , a 87 y.o. female  was evaluated in triage.  Pt complains of episode of near syncope.  Lives at home with 24/7 caregivers.  Had a big meal last night.  Sugars in the 300s this morning.  She is feeling hungry, she was fixing breakfast when she developed dizziness and presyncope, she was helped to the ground by her caregiver with improvement of her symptoms.  Feels fine now.  No complaints.  Just hungry..  Review of Systems  Positive: Presyncope and hyperglycemia Negative:   Physical Exam  BP (!) 144/81 (BP Location: Left Arm)   Pulse 94   Temp 98.1 F (36.7 C) (Oral)   Resp 16   SpO2 99%  Gen:   Awake, no distress   Resp:  Normal effort  MSK:   Moves extremities without difficulty  Other:    Medical Decision Making  Medically screening exam initiated at 12:08 PM.  Appropriate orders placed.  SASCHA PALMA was informed that the remainder of the evaluation will be completed by another provider, this initial triage assessment does not replace that evaluation, and the importance of remaining in the ED until their evaluation is complete.     Delton Prairie, MD 11/11/21 [phone]

## 2021-11-11 NOTE — ED Provider Notes (Signed)
[facility] Emergency Department Provider Note  ____________________________________________  Time seen: Approximately 8:21 PM  I have reviewed the triage vital signs and the nursing notes.   HISTORY  Chief Complaint Near Syncope    HPI Jillian Moore is a 87 y.o. female with a history of GERD, hypertension, diabetes who comes the ED complaining of near syncope this morning.  She was in her usual state of health, walking into the kitchen to get breakfast, when she got lightheaded and appeared to be losing her balance.  Her home health nurse helped lower her safely to the floor.  Denies any chest pain shortness of breath headaches or trauma.  Denies any other acute symptoms recently.  Patient caregiver at bedside notes the patient does not drink enough fluids and has had previous ED visits for dehydration.    Past Medical History:  Diagnosis Date   Arthritis    left knee   Diabetes mellitus without complication (HCC)    Patient takes Metformin and Insulin   GERD (gastroesophageal reflux disease)    Hypertension    Pulmonary emboli (HCC) 12/2016   Wears dentures    full upper, partial lower     Patient Active Problem List   Diagnosis Date Noted   Subclavian arterial stenosis (HCC) 04/16/2017   Essential hypertension 04/16/2017   Acute pulmonary embolism (HCC) 04/16/2017   Diabetes (HCC) 04/16/2017     Past Surgical History:  Procedure Laterality Date   ABDOMINAL HYSTERECTOMY     CATARACT EXTRACTION W/PHACO Right 09/18/2017   Procedure: CATARACT EXTRACTION PHACO AND INTRAOCULAR LENS PLACEMENT (IOC)  DIABETIC RIGHT;  Surgeon: Nevada Crane, MD;  Location: [facility];  Service: Ophthalmology;  Laterality: Right;  Diabetic - insulin and oral meds   CHOLECYSTECTOMY       Prior to Admission medications   Medication Sig Start Date End Date Taking? Authorizing Provider  acetaminophen (TYLENOL) 650 MG CR tablet Take 650 mg by mouth  every 8 (eight) hours as needed for pain.    [provider]  ALPRAZolam (XANAX) 0.25 MG tablet TAKE ONE TABLET BY MOUTH EVERY DAY AS NEEDED FOR ANXIETY 03/13/17   [provider]  amLODipine (NORVASC) 5 MG tablet Take 5 mg by mouth daily.  04/12/17   [provider]  aspirin EC 81 MG tablet Take 81 mg by mouth daily.    [provider]  cyclobenzaprine (FLEXERIL) 5 MG tablet Take 1 tablet (5 mg total) by mouth 2 (two) times daily. 09/23/19   Joni Reining, PA-C  docusate sodium (DOK) 100 MG capsule Take 100 mg by mouth 2 (two) times daily. 11/04/18   [provider]  Dulaglutide 1.5 MG/0.5ML SOPN Inject 1.5 mg into the skin every 7 (seven) days. 01/07/19   [provider]  gabapentin (NEURONTIN) 100 MG capsule Take 100 mg by mouth at bedtime.  03/26/17   [provider]  GNP EASY TOUCH GLUCOSE TEST test strip 2 (two) times daily.  01/29/17   [provider]  lansoprazole (PREVACID) 30 MG capsule Take 30 mg by mouth daily at 12 noon.    [provider]  LANTUS SOLOSTAR 100 UNIT/ML Solostar Pen Inject 20 Units into the skin at bedtime.  04/12/17   [provider]  LINZESS 145 MCG CAPS capsule Take 145 mcg by mouth daily. 11/21/18   [provider]  losartan-hydrochlorothiazide (HYZAAR) 50-12.5 MG tablet Take 2 tablets by mouth daily. 01/25/19   [provider]  metFORMIN (GLUCOPHAGE) 1000 MG tablet Take 1,000 mg by mouth daily.  03/26/17   [provider]  metoprolol tartrate (LOPRESSOR) 50 MG tablet Take 50 mg by mouth daily. 01/20/17   [provider]  ondansetron (ZOFRAN) 4 MG tablet Take 4 mg by mouth every 8 (eight) hours as needed for nausea. 01/07/19   [provider]  pantoprazole (PROTONIX) 40 MG tablet Take 40 mg by mouth daily. 01/25/19   [provider]  polyethylene glycol powder (GLYCOLAX/MIRALAX) powder 1 cap full in a full glass of water, two times a day  for 3 days. 01/24/19   Sharman Cheek, MD  potassium chloride (MICRO-K) 10 MEQ CR capsule Take 10 mEq by mouth daily.     [provider]  rosuvastatin (CRESTOR) 10 MG tablet Take 10 mg by mouth daily.    [provider]  senna (SENNA-TIME) 8.6 MG tablet Take 2 tablets by mouth daily.    [provider]  TRADJENTA 5 MG TABS tablet  04/12/17   [provider]     Allergies Meloxicam and Penicillins   Family History  Problem Relation Age of Onset   Dementia Mother    Diabetes Father    Cancer Paternal Aunt     Social History Social History   Tobacco Use   Smoking status: Former    Types: Cigarettes    Quit date: 1970    Years since quitting: 52.9   Smokeless tobacco: Never  Vaping Use   Vaping Use: Never used  Substance Use Topics   Alcohol use: No   Drug use: No    Review of Systems  Constitutional:   No fever or chills.  ENT:   No sore throat. No rhinorrhea. Cardiovascular:   No chest pain or syncope. Respiratory:   No dyspnea or cough. Gastrointestinal:   Negative for abdominal pain, vomiting and diarrhea.  Musculoskeletal:   Negative for focal pain or swelling All other systems reviewed and are negative except as documented above in ROS and HPI.  ____________________________________________   PHYSICAL EXAM:  VITAL SIGNS: ED Triage Vitals  Enc Vitals Group     BP 11/11/21 1157 (!) 144/81     Pulse Rate 11/11/21 1157 94     Resp 11/11/21 1157 16     Temp 11/11/21 1157 98.1 F (36.7 C)     Temp Source 11/11/21 1157 Oral     SpO2 11/11/21 1157 99 %     Weight --      Height --      Head Circumference --      Peak Flow --      Pain Score 11/11/21 1100 0     Pain Loc --      Pain Edu? --      Excl. in GC? --     Vital signs reviewed, nursing assessments reviewed.   Constitutional:   Alert and oriented. Non-toxic appearance. Eyes:   Conjunctivae are normal. EOMI. PERRL. ENT      Head:   Normocephalic and  atraumatic.      Nose:   Normal      Mouth/Throat:   Dry mucous membranes.      Neck:   No meningismus. Full ROM. Hematological/Lymphatic/Immunilogical:   No cervical lymphadenopathy. Cardiovascular:   RRR. Symmetric bilateral radial and DP pulses.  No murmurs. Cap refill less than 2 seconds. Respiratory:   Normal respiratory effort without tachypnea/retractions. Breath sounds are clear and equal bilaterally. No wheezes/rales/rhonchi. Gastrointestinal:  Soft and nontender. Non distended. There is no CVA tenderness.  No rebound, rigidity, or guarding. Genitourinary:   deferred Musculoskeletal:   Normal range of motion in all extremities. No joint effusions.  No lower extremity tenderness.  No edema. Neurologic:   Normal speech and language.  Motor grossly intact. No acute focal neurologic deficits are appreciated.  Skin:    Skin is warm, dry and intact. No rash noted.  No petechiae, purpura, or bullae.  ____________________________________________    LABS (pertinent positives/negatives) (all labs ordered are listed, but only abnormal results are displayed) Labs Reviewed  BASIC METABOLIC PANEL - Abnormal; Notable for the following components:      Result Value   Glucose, Bld 326 (*)    All other components within normal limits  CBC - Abnormal; Notable for the following components:   HCT 35.3 (*)    All other components within normal limits  URINALYSIS, ROUTINE W REFLEX MICROSCOPIC - Abnormal; Notable for the following components:   Color, Urine STRAW (*)    APPearance CLEAR (*)    Glucose, UA >=500 (*)    Hgb urine dipstick SMALL (*)    Ketones, ur 5 (*)    Bacteria, UA RARE (*)    All other components within normal limits  CBG MONITORING, ED - Abnormal; Notable for the following components:   Glucose-Capillary 270 (*)    All other components within normal limits   ____________________________________________   EKG  Interpreted by me Normal sinus rhythm rate of 93.   Normal axis and intervals.  Poor R wave progression.  Normal ST segments and T waves.  ____________________________________________    RADIOLOGY  No results found.  ____________________________________________   PROCEDURES Procedures  ____________________________________________  DIFFERENTIAL DIAGNOSIS   Dehydration, electrolyte abnormality, UTI, anemia  CLINICAL IMPRESSION / ASSESSMENT AND PLAN / ED COURSE  Medications ordered in the ED: Medications  lactated ringers bolus 1,000 mL (0 mLs Intravenous Stopped 11/11/21 2017)  insulin aspart (novoLOG) injection 6 Units (6 Units Intravenous Given 11/11/21 1825)    Pertinent labs & imaging results that were available during my care of the patient were reviewed by me and considered in my medical decision making (see chart for details).  DEYSI SOLDO was evaluated in Emergency Department on 11/11/2021 for the symptoms described in the history of present illness. She was evaluated in the context of the global COVID-19 pandemic, which necessitated consideration that the patient might be at risk for infection with the SARS-CoV-2 virus that causes COVID-19. Institutional protocols and algorithms that pertain to the evaluation of patients at risk for COVID-19 are in a state of rapid change based on information released by regulatory bodies including the CDC and federal and state organizations. These policies and algorithms were followed during the patient's care in the ED.   Patient presents with near syncope, likely due to dehydration.  No acute focal symptoms.  Exam is nonfocal, reassuring.  Vital signs unremarkable.  Lab panel unremarkable except for glucose of 320.  Patient given IV fluids and a sliding scale dose of aspart insulin, glucose is improved, patient feels much better, stable for discharge.      ____________________________________________   FINAL CLINICAL IMPRESSION(S) / ED DIAGNOSES    Final diagnoses:  Dehydration   Weakness     ED Discharge Orders     None       Portions of this note were generated with dragon dictation software. Dictation errors may occur despite best attempts at proofreading.  Sharman Cheek, MD 11/11/21 2025

## 2022-11-08 ENCOUNTER — Encounter: Payer: Self-pay | Admitting: Oncology

## 2022-11-08 ENCOUNTER — Inpatient Hospital Stay: Payer: Medicare Other

## 2022-11-08 ENCOUNTER — Inpatient Hospital Stay: Payer: Medicare Other | Attending: Oncology | Admitting: Oncology

## 2022-11-08 VITALS — BP 137/70 | HR 85 | Temp 97.1°F | Resp 16 | Wt 174.0 lb

## 2022-11-08 DIAGNOSIS — D509 Iron deficiency anemia, unspecified: Secondary | ICD-10-CM | POA: Diagnosis present

## 2022-11-08 DIAGNOSIS — I10 Essential (primary) hypertension: Secondary | ICD-10-CM | POA: Diagnosis not present

## 2022-11-08 DIAGNOSIS — E119 Type 2 diabetes mellitus without complications: Secondary | ICD-10-CM | POA: Diagnosis not present

## 2022-11-08 DIAGNOSIS — Z90710 Acquired absence of both cervix and uterus: Secondary | ICD-10-CM | POA: Insufficient documentation

## 2022-11-08 DIAGNOSIS — Z87891 Personal history of nicotine dependence: Secondary | ICD-10-CM | POA: Diagnosis not present

## 2022-11-08 DIAGNOSIS — Z86711 Personal history of pulmonary embolism: Secondary | ICD-10-CM | POA: Diagnosis not present

## 2022-11-08 NOTE — Addendum Note (Signed)
Addended by: Suzan Slick on: 11/08/2022 03:38 PM   Modules accepted: Orders

## 2022-11-08 NOTE — Progress Notes (Signed)
Hematology/Oncology Consult note [facility] [phone] [phone]  Patient Care Team: Barbette Reichmann, MD as PCP - General (Internal Medicine)   Name of the patient: Jillian Moore  [id number]  09-07-1926    Reason for referral-anemia   Referring physician-Dr. Mia Creek  Date of visit: 11/08/22   History of presenting illness-patient is a 88 year old female with a past medical history significant for GERD hypertension type 2 diabetes and history of PE back in 2018.  She has been referred for anemia.  Most recent CBC from 10/17/2022 showed H&H of 9.7/31.1 with an MCV of 79.5 white count and platelets were normal.  Ferritin levels were low at 7.  B12 levels were normal at greater than 1500.  TSH was normal.  Looking back at her prior CBCs patient's hemoglobin has been between 11-12 up until March 2023.  In July 2023 it dropped down to 9.8 patient has seen Dr. Mia Creek from GI for iron deficiency anemia risks and benefits of endoscopic evaluation were discussed with the patient.  Given her age endoscopy evaluation was not pursued.  Patient is doing well for her age.  She remains independent of her ADLs.  Denies any blood loss in her stool or urine.  She has tried taking oral iron but that has not helped.  She reports ongoing fatigue  ECOG PS- 1  Pain scale- 0   Review of systems- Review of Systems  Constitutional:  Positive for malaise/fatigue. Negative for chills, fever and weight loss.  HENT:  Negative for congestion, ear discharge and nosebleeds.   Eyes:  Negative for blurred vision.  Respiratory:  Negative for cough, hemoptysis, sputum production, shortness of breath and wheezing.   Cardiovascular:  Negative for chest pain, palpitations, orthopnea and claudication.  Gastrointestinal:  Negative for abdominal pain, blood in stool, constipation, diarrhea, heartburn, melena, nausea and vomiting.  Genitourinary:  Negative for dysuria, flank  pain, frequency, hematuria and urgency.  Musculoskeletal:  Negative for back pain, joint pain and myalgias.  Skin:  Negative for rash.  Neurological:  Negative for dizziness, tingling, focal weakness, seizures, weakness and headaches.  Endo/Heme/Allergies:  Does not bruise/bleed easily.  Psychiatric/Behavioral:  Negative for depression and suicidal ideas. The patient does not have insomnia.     Allergies  Allergen Reactions   Meloxicam Other (See Comments)    Dizziness   Penicillins Rash    Patient Active Problem List   Diagnosis Date Noted   Subclavian arterial stenosis (HCC) 04/16/2017   Essential hypertension 04/16/2017   Acute pulmonary embolism (HCC) 04/16/2017   Diabetes (HCC) 04/16/2017     Past Medical History:  Diagnosis Date   Arthritis    left knee   Diabetes mellitus without complication (HCC)    Patient takes Metformin and Insulin   GERD (gastroesophageal reflux disease)    Hypertension    Pulmonary emboli (HCC) 12/2016   Wears dentures    full upper, partial lower     Past Surgical History:  Procedure Laterality Date   ABDOMINAL HYSTERECTOMY     CATARACT EXTRACTION W/PHACO Right 09/18/2017   Procedure: CATARACT EXTRACTION PHACO AND INTRAOCULAR LENS PLACEMENT (IOC)  DIABETIC RIGHT;  Surgeon: Nevada Crane, MD;  Location: [facility];  Service: Ophthalmology;  Laterality: Right;  Diabetic - insulin and oral meds   CHOLECYSTECTOMY      Social History   Socioeconomic History   Marital status: Widowed    Spouse name: Not on file   Number of children: Not on file  Years of education: Not on file   Highest education level: Not on file  Occupational History   Not on file  Tobacco Use   Smoking status: Former    Types: Cigarettes    Quit date: 92    Years since quitting: 53.9   Smokeless tobacco: Never  Vaping Use   Vaping Use: Never used  Substance and Sexual Activity   Alcohol use: No   Drug use: No   Sexual activity: Never   Other Topics Concern   Not on file  Social History Narrative   Not on file   Social Determinants of Health   Financial Resource Strain: Not on file  Food Insecurity: Not on file  Transportation Needs: Not on file  Physical Activity: Not on file  Stress: Not on file  Social Connections: Not on file  Intimate Partner Violence: Not on file     Family History  Problem Relation Age of Onset   Dementia Mother    Diabetes Father    Cancer Paternal Aunt      Current Outpatient Medications:    aspirin EC 81 MG tablet, Take 81 mg by mouth daily., Disp: , Rfl:    acetaminophen (TYLENOL) 650 MG CR tablet, Take 650 mg by mouth every 8 (eight) hours as needed for pain., Disp: , Rfl:    ALPRAZolam (XANAX) 0.25 MG tablet, TAKE ONE TABLET BY MOUTH EVERY DAY AS NEEDED FOR ANXIETY, Disp: , Rfl:    amLODipine (NORVASC) 5 MG tablet, Take 5 mg by mouth daily. , Disp: , Rfl:    cyclobenzaprine (FLEXERIL) 5 MG tablet, Take 1 tablet (5 mg total) by mouth 2 (two) times daily., Disp: 10 tablet, Rfl: 0   docusate sodium (DOK) 100 MG capsule, Take 100 mg by mouth 2 (two) times daily., Disp: , Rfl:    Dulaglutide 1.5 MG/0.5ML SOPN, Inject 1.5 mg into the skin every 7 (seven) days., Disp: , Rfl:    gabapentin (NEURONTIN) 100 MG capsule, Take 100 mg by mouth at bedtime. , Disp: , Rfl:    GNP EASY TOUCH GLUCOSE TEST test strip, 2 (two) times daily. , Disp: , Rfl: 1   lansoprazole (PREVACID) 30 MG capsule, Take 30 mg by mouth daily at 12 noon., Disp: , Rfl:    LANTUS SOLOSTAR 100 UNIT/ML Solostar Pen, Inject 20 Units into the skin at bedtime. , Disp: , Rfl:    LINZESS 145 MCG CAPS capsule, Take 145 mcg by mouth daily., Disp: , Rfl:    losartan-hydrochlorothiazide (HYZAAR) 50-12.5 MG tablet, Take 2 tablets by mouth daily., Disp: , Rfl:    metFORMIN (GLUCOPHAGE) 1000 MG tablet, Take 1,000 mg by mouth daily. , Disp: , Rfl:    metoprolol tartrate (LOPRESSOR) 50 MG tablet, Take 50 mg by mouth daily., Disp: , Rfl:     ondansetron (ZOFRAN) 4 MG tablet, Take 4 mg by mouth every 8 (eight) hours as needed for nausea., Disp: , Rfl:    pantoprazole (PROTONIX) 40 MG tablet, Take 40 mg by mouth daily., Disp: , Rfl:    polyethylene glycol powder (GLYCOLAX/MIRALAX) powder, 1 cap full in a full glass of water, two times a day for 3 days., Disp: 255 g, Rfl: 0   potassium chloride (MICRO-K) 10 MEQ CR capsule, Take 10 mEq by mouth daily. , Disp: , Rfl:    rosuvastatin (CRESTOR) 10 MG tablet, Take 10 mg by mouth daily., Disp: , Rfl:    senna (SENNA-TIME) 8.6 MG tablet, Take 2 tablets by  mouth daily., Disp: , Rfl:    TRADJENTA 5 MG TABS tablet, , Disp: , Rfl:    Physical exam: There were no vitals filed for this visit. Physical Exam Constitutional:      General: She is not in acute distress. Cardiovascular:     Rate and Rhythm: Normal rate and regular rhythm.     Heart sounds: Normal heart sounds.  Pulmonary:     Effort: Pulmonary effort is normal.     Breath sounds: Normal breath sounds.  Abdominal:     General: Bowel sounds are normal.     Palpations: Abdomen is soft.  Skin:    General: Skin is warm and dry.  Neurological:     Mental Status: She is alert and oriented to person, place, and time.           Latest Ref Rng & Units 11/11/2021   11:57 AM  CMP  Glucose 70 - 99 mg/dL 161   BUN 8 - 23 mg/dL 18   Creatinine 0.96 - 1.00 mg/dL 0.45   Sodium 409 - 811 mmol/L 136   Potassium 3.5 - 5.1 mmol/L 3.6   Chloride 98 - 111 mmol/L 100   CO2 22 - 32 mmol/L 26   Calcium 8.9 - 10.3 mg/dL 9.2       Latest Ref Rng & Units 11/11/2021   11:57 AM  CBC  WBC 4.0 - 10.5 K/uL 7.0   Hemoglobin 12.0 - 15.0 g/dL 91.4   Hematocrit 78.2 - 46.0 % 35.3   Platelets 150 - 400 K/uL 203     Assessment and plan- Patient is a 88 y.o. female referred for iron deficiency anemia  Iron deficiency anemia of unclear etiology.  GI workup not pursued due to her age.  Patient does not report any active bleeding.  She has a  prior history of PE but not presently on any blood thinners.  Oral iron has not helped.  We will proceed with IV iron at this time either Venofer x 5 or Feraheme x 2 depending on what her insurance will approve.  Discussed risks and benefits of IV iron including all but not limited to possible risk of allergic and anaphylactic reaction.  Patient understands and agrees to proceed as planned.  I am not getting any labs today since patient already has had her baseline labs recently.  I will repeat CBC ferritin and iron studies in about 2-1/2 months and see her thereafter   Thank you for this kind referral and the opportunity to participate in the care of this  Patient   Visit Diagnosis 1. Iron deficiency anemia, unspecified iron deficiency anemia type     Dr. Owens Shark, MD, MPH [facility] [phone] 11/08/2022

## 2022-11-08 NOTE — Addendum Note (Signed)
Addended by: Corene Cornea on: 11/08/2022 02:18 PM   Modules accepted: Orders

## 2022-11-08 NOTE — Progress Notes (Signed)
Pt and daughter per referral by Dr Mia Creek for iron deficiency anemia.

## 2022-11-13 ENCOUNTER — Other Ambulatory Visit: Payer: Self-pay | Admitting: Oncology

## 2022-11-13 ENCOUNTER — Inpatient Hospital Stay: Payer: Medicare Other | Attending: Oncology

## 2022-11-13 VITALS — BP 150/70 | HR 82 | Temp 96.7°F | Resp 16

## 2022-11-13 DIAGNOSIS — Z79899 Other long term (current) drug therapy: Secondary | ICD-10-CM | POA: Diagnosis not present

## 2022-11-13 DIAGNOSIS — D508 Other iron deficiency anemias: Secondary | ICD-10-CM

## 2022-11-13 DIAGNOSIS — D509 Iron deficiency anemia, unspecified: Secondary | ICD-10-CM | POA: Diagnosis present

## 2022-11-13 MED ORDER — SODIUM CHLORIDE 0.9 % IV SOLN
510.0000 mg | INTRAVENOUS | Status: DC
  Administered 2022-11-13: 510 mg via INTRAVENOUS
  Filled 2022-11-13: qty 510

## 2022-11-13 MED ORDER — SODIUM CHLORIDE 0.9 % IV SOLN
Freq: Once | INTRAVENOUS | Status: AC
  Filled 2022-11-13: qty 250

## 2022-11-13 NOTE — Patient Instructions (Signed)

## 2022-11-22 ENCOUNTER — Inpatient Hospital Stay: Payer: Medicare Other

## 2022-11-22 VITALS — BP 142/71 | HR 83 | Temp 97.1°F | Resp 16

## 2022-11-22 DIAGNOSIS — D509 Iron deficiency anemia, unspecified: Secondary | ICD-10-CM | POA: Diagnosis not present

## 2022-11-22 DIAGNOSIS — D508 Other iron deficiency anemias: Secondary | ICD-10-CM

## 2022-11-22 MED ORDER — SODIUM CHLORIDE 0.9 % IV SOLN
510.0000 mg | INTRAVENOUS | Status: DC
  Administered 2022-11-22: 510 mg via INTRAVENOUS
  Filled 2022-11-22: qty 510

## 2022-11-22 MED ORDER — SODIUM CHLORIDE 0.9 % IV SOLN
Freq: Once | INTRAVENOUS | Status: AC
  Filled 2022-11-22: qty 250

## 2023-01-22 ENCOUNTER — Inpatient Hospital Stay: Payer: Medicare Other

## 2023-01-22 ENCOUNTER — Inpatient Hospital Stay: Payer: Medicare Other | Attending: Oncology | Admitting: Oncology

## 2023-01-22 ENCOUNTER — Encounter: Payer: Self-pay | Admitting: Oncology

## 2023-01-22 VITALS — BP 154/86 | HR 90 | Temp 96.5°F | Resp 19 | Wt 171.5 lb

## 2023-01-22 DIAGNOSIS — E119 Type 2 diabetes mellitus without complications: Secondary | ICD-10-CM | POA: Insufficient documentation

## 2023-01-22 DIAGNOSIS — Z87891 Personal history of nicotine dependence: Secondary | ICD-10-CM | POA: Insufficient documentation

## 2023-01-22 DIAGNOSIS — M129 Arthropathy, unspecified: Secondary | ICD-10-CM | POA: Diagnosis not present

## 2023-01-22 DIAGNOSIS — Z7982 Long term (current) use of aspirin: Secondary | ICD-10-CM | POA: Diagnosis not present

## 2023-01-22 DIAGNOSIS — Z86711 Personal history of pulmonary embolism: Secondary | ICD-10-CM | POA: Diagnosis not present

## 2023-01-22 DIAGNOSIS — D509 Iron deficiency anemia, unspecified: Secondary | ICD-10-CM | POA: Diagnosis present

## 2023-01-22 DIAGNOSIS — Z7984 Long term (current) use of oral hypoglycemic drugs: Secondary | ICD-10-CM | POA: Insufficient documentation

## 2023-01-22 DIAGNOSIS — D508 Other iron deficiency anemias: Secondary | ICD-10-CM | POA: Diagnosis not present

## 2023-01-22 DIAGNOSIS — Z794 Long term (current) use of insulin: Secondary | ICD-10-CM | POA: Diagnosis not present

## 2023-01-22 DIAGNOSIS — K219 Gastro-esophageal reflux disease without esophagitis: Secondary | ICD-10-CM | POA: Insufficient documentation

## 2023-01-22 DIAGNOSIS — Z79899 Other long term (current) drug therapy: Secondary | ICD-10-CM | POA: Insufficient documentation

## 2023-01-22 DIAGNOSIS — I10 Essential (primary) hypertension: Secondary | ICD-10-CM | POA: Insufficient documentation

## 2023-01-22 LAB — IRON AND TIBC
Iron: 42 ug/dL (ref 28–170)
Saturation Ratios: 18 % (ref 10.4–31.8)
TIBC: 235 ug/dL — ABNORMAL LOW (ref 250–450)
UIBC: 193 ug/dL

## 2023-01-22 LAB — CBC
HCT: 37.2 % (ref 36.0–46.0)
Hemoglobin: 12.4 g/dL (ref 12.0–15.0)
MCH: 27.6 pg (ref 26.0–34.0)
MCHC: 33.3 g/dL (ref 30.0–36.0)
MCV: 82.7 fL (ref 80.0–100.0)
Platelets: 202 10*3/uL (ref 150–400)
RBC: 4.5 MIL/uL (ref 3.87–5.11)
RDW: 18.7 % — ABNORMAL HIGH (ref 11.5–15.5)
WBC: 7.3 10*3/uL (ref 4.0–10.5)
nRBC: 0 % (ref 0.0–0.2)

## 2023-01-22 LAB — FERRITIN: Ferritin: 196 ng/mL (ref 11–307)

## 2023-01-22 NOTE — Progress Notes (Signed)
Hematology/Oncology Consult note [facility]  Telephone:([phone] [phone]  Patient Care Team: Tracie Harrier, MD as PCP - General (Internal Medicine)   Name of the patient: Jillian Moore  [id number]  [birth year]   Date of visit: 01/22/23  Diagnosis-iron deficiency anemia  Chief complaint/ Reason for visit-routine follow-up of iron deficiency anemia  Heme/Onc history: patient is a 89 year old female with a past medical history significant for GERD hypertension type 2 diabetes and history of PE back in 2018.  She has been referred for anemia.  Most recent CBC from 10/17/2022 showed H&H of 9.7/31.1 with an MCV of 79.5 white count and platelets were normal.  Ferritin levels were low at 7.  B12 levels were normal at greater than 1500.  TSH was normal.  Looking back at her prior CBCs patient's hemoglobin has been between 11-12 up until March 2023.  In July 2023 it dropped down to 9.8 patient has seen Dr. Haig Prophet from GI for iron deficiency anemia risks and benefits of endoscopic evaluation were discussed with the patient.  Given her age endoscopy evaluation was not pursued.    Interval history-she reports feeling better after receiving IV iron.  Energy levels are improved.  She denies any blood loss in her stool or urine.  ECOG PS- 1 Pain scale- 0  Review of systems- Review of Systems  Constitutional:  Negative for chills, fever, malaise/fatigue and weight loss.  HENT:  Negative for congestion, ear discharge and nosebleeds.   Eyes:  Negative for blurred vision.  Respiratory:  Negative for cough, hemoptysis, sputum production, shortness of breath and wheezing.   Cardiovascular:  Negative for chest pain, palpitations, orthopnea and claudication.  Gastrointestinal:  Negative for abdominal pain, blood in stool, constipation, diarrhea, heartburn, melena, nausea and vomiting.  Genitourinary:  Negative for dysuria, flank pain, frequency, hematuria and  urgency.  Musculoskeletal:  Negative for back pain, joint pain and myalgias.  Skin:  Negative for rash.  Neurological:  Negative for dizziness, tingling, focal weakness, seizures, weakness and headaches.  Endo/Heme/Allergies:  Does not bruise/bleed easily.  Psychiatric/Behavioral:  Negative for depression and suicidal ideas. The patient does not have insomnia.       Allergies  Allergen Reactions   Meloxicam Other (See Comments)    Dizziness   Penicillins Rash     Past Medical History:  Diagnosis Date   Arthritis    left knee   Diabetes mellitus without complication ([place])    Patient takes Metformin and Insulin   GERD (gastroesophageal reflux disease)    Hypertension    Pulmonary emboli ([place]) 12/2016   Wears dentures    full upper, partial lower     Past Surgical History:  Procedure Laterality Date   ABDOMINAL HYSTERECTOMY     CATARACT EXTRACTION W/PHACO Right 09/18/2017   Procedure: CATARACT EXTRACTION PHACO AND INTRAOCULAR LENS PLACEMENT ([place])  DIABETIC RIGHT;  Surgeon: Eulogio Bear, MD;  Location: [place];  Service: Ophthalmology;  Laterality: Right;  Diabetic - insulin and oral meds   CHOLECYSTECTOMY      Social History   Socioeconomic History   Marital status: Widowed    Spouse name: Not on file   Number of children: Not on file   Years of education: Not on file   Highest education level: Not on file  Occupational History   Not on file  Tobacco Use   Smoking status: Former    Types: Cigarettes    Quit date: 30  Years since quitting: 54.1   Smokeless tobacco: Never  Vaping Use   Vaping Use: Never used  Substance and Sexual Activity   Alcohol use: No   Drug use: No   Sexual activity: Not Currently  Other Topics Concern   Not on file  Social History Narrative   Not on file   Social Determinants of Health   Financial Resource Strain: Not on file  Food Insecurity: Not on file  Transportation Needs: Not on file  Physical  Activity: Not on file  Stress: Not on file  Social Connections: Not on file  Intimate Partner Violence: Not on file    Family History  Problem Relation Age of Onset   Dementia Mother    Diabetes Father    Cancer Paternal Aunt      Current Outpatient Medications:    amLODipine (NORVASC) 2.5 MG tablet, Take by mouth., Disp: , Rfl:    aspirin EC 81 MG tablet, Take 81 mg by mouth daily., Disp: , Rfl:    docusate sodium (DOK) 100 MG capsule, Take 100 mg by mouth 2 (two) times daily., Disp: , Rfl:    escitalopram (LEXAPRO) 5 MG tablet, Take by mouth., Disp: , Rfl:    GNP EASY TOUCH GLUCOSE TEST test strip, 2 (two) times daily. , Disp: , Rfl: 1   Insulin Aspart FlexPen (NOVOLOG) 100 UNIT/ML, Inject 12 units before the LUNCH meal only, Disp: , Rfl:    iron polysaccharides (NIFEREX) 150 MG capsule, Take 150 mg by mouth daily., Disp: , Rfl:    LANTUS SOLOSTAR 100 UNIT/ML Solostar Pen, Inject 20 Units into the skin at bedtime. , Disp: , Rfl:    linagliptin (TRADJENTA) 5 MG TABS tablet, Take by mouth., Disp: , Rfl:    memantine (NAMENDA) 10 MG tablet, Take 38m in the morning and 143mat night for one week, then increase to 1033mwice daily., Disp: , Rfl:    metFORMIN (GLUCOPHAGE) 500 MG tablet, Take 500 mg by mouth 2 (two) times daily., Disp: , Rfl:    metoprolol tartrate (LOPRESSOR) 50 MG tablet, Take 50 mg by mouth 2 (two) times daily., Disp: , Rfl:    omeprazole (PRILOSEC OTC) 20 MG tablet, Take by mouth., Disp: , Rfl:    rosuvastatin (CRESTOR) 10 MG tablet, Take 10 mg by mouth daily., Disp: , Rfl:    rosuvastatin (CRESTOR) 10 MG tablet, Take 1 tablet by mouth daily., Disp: , Rfl:    senna (SENNA-TIME) 8.6 MG tablet, Take 2 tablets by mouth daily., Disp: , Rfl:   Physical exam:  Vitals:   01/22/23 1359  BP: (!) 154/86  Pulse: 90  Resp: 19  Temp: (!) 96.5 F (35.8 C)  SpO2: 99%  Weight: 171 lb 8 oz (77.8 kg)   Physical Exam Cardiovascular:     Rate and Rhythm: Normal rate and  regular rhythm.     Heart sounds: Normal heart sounds.  Pulmonary:     Effort: Pulmonary effort is normal.     Breath sounds: Normal breath sounds.  Skin:    General: Skin is warm and dry.  Neurological:     Mental Status: She is alert and oriented to person, place, and time.         Latest Ref Rng & Units 11/11/2021   11:57 AM  CMP  Glucose 70 - 99 mg/dL 326   BUN 8 - 23 mg/dL 18   Creatinine 0.44 - 1.00 mg/dL 0.74   Sodium 135 - 145 mmol/L  136   Potassium 3.5 - 5.1 mmol/L 3.6   Chloride 98 - 111 mmol/L 100   CO2 22 - 32 mmol/L 26   Calcium 8.9 - 10.3 mg/dL 9.2       Latest Ref Rng & Units 01/22/2023    1:36 PM  CBC  WBC 4.0 - 10.5 K/uL 7.3   Hemoglobin 12.0 - 15.0 g/dL 12.4   Hematocrit 36.0 - 46.0 % 37.2   Platelets 150 - 400 K/uL 202      Assessment and plan- Patient is a 89 y.o. female seen for follow-up of iron deficiency anemia  Patient's hemoglobin has improved from 9.8 presently to 12.4.  Iron studies are presently showing normal ferritin of 196.  TIBC is mildly low likely indicating some element of chronic disease.  She does not require any IV iron at this time.  I will repeat his CBC ferritin and iron studies in 2 4 and 6 months and see her back in 6 months    Visit Diagnosis 1. Other iron deficiency anemia      Dr. Randa Evens, MD, MPH [facility] [id number] 01/22/2023 6:26 PM

## 2023-03-23 ENCOUNTER — Inpatient Hospital Stay: Payer: Medicare Other | Attending: Oncology

## 2023-03-23 ENCOUNTER — Inpatient Hospital Stay: Payer: Medicare Other

## 2023-03-23 DIAGNOSIS — D508 Other iron deficiency anemias: Secondary | ICD-10-CM

## 2023-03-23 DIAGNOSIS — Z79899 Other long term (current) drug therapy: Secondary | ICD-10-CM | POA: Insufficient documentation

## 2023-03-23 DIAGNOSIS — D509 Iron deficiency anemia, unspecified: Secondary | ICD-10-CM | POA: Insufficient documentation

## 2023-03-23 LAB — IRON AND TIBC
Iron: 48 ug/dL (ref 28–170)
Saturation Ratios: 19 % (ref 10.4–31.8)
TIBC: 249 ug/dL — ABNORMAL LOW (ref 250–450)
UIBC: 201 ug/dL

## 2023-03-23 LAB — CBC
HCT: 35.8 % — ABNORMAL LOW (ref 36.0–46.0)
Hemoglobin: 11.9 g/dL — ABNORMAL LOW (ref 12.0–15.0)
MCH: 29 pg (ref 26.0–34.0)
MCHC: 33.2 g/dL (ref 30.0–36.0)
MCV: 87.3 fL (ref 80.0–100.0)
Platelets: 193 10*3/uL (ref 150–400)
RBC: 4.1 MIL/uL (ref 3.87–5.11)
RDW: 14.8 % (ref 11.5–15.5)
WBC: 7.5 10*3/uL (ref 4.0–10.5)
nRBC: 0 % (ref 0.0–0.2)

## 2023-03-23 LAB — FERRITIN: Ferritin: 147 ng/mL (ref 11–307)

## 2023-05-23 ENCOUNTER — Inpatient Hospital Stay: Payer: Medicare Other

## 2023-05-23 ENCOUNTER — Inpatient Hospital Stay: Payer: Medicare Other | Attending: Oncology

## 2023-05-23 DIAGNOSIS — D509 Iron deficiency anemia, unspecified: Secondary | ICD-10-CM | POA: Insufficient documentation

## 2023-05-23 DIAGNOSIS — D508 Other iron deficiency anemias: Secondary | ICD-10-CM

## 2023-05-23 LAB — CBC
HCT: 36.1 % (ref 36.0–46.0)
Hemoglobin: 12.2 g/dL (ref 12.0–15.0)
MCH: 29.5 pg (ref 26.0–34.0)
MCHC: 33.8 g/dL (ref 30.0–36.0)
MCV: 87.4 fL (ref 80.0–100.0)
Platelets: 200 10*3/uL (ref 150–400)
RBC: 4.13 MIL/uL (ref 3.87–5.11)
RDW: 14.3 % (ref 11.5–15.5)
WBC: 7.6 10*3/uL (ref 4.0–10.5)
nRBC: 0 % (ref 0.0–0.2)

## 2023-05-23 LAB — FERRITIN: Ferritin: 102 ng/mL (ref 11–307)

## 2023-05-23 LAB — IRON AND TIBC
Iron: 59 ug/dL (ref 28–170)
Saturation Ratios: 23 % (ref 10.4–31.8)
TIBC: 253 ug/dL (ref 250–450)
UIBC: 194 ug/dL

## 2023-07-23 ENCOUNTER — Encounter: Payer: Self-pay | Admitting: Oncology

## 2023-07-23 ENCOUNTER — Ambulatory Visit: Payer: Medicare Other | Admitting: Oncology

## 2023-07-23 ENCOUNTER — Inpatient Hospital Stay: Payer: Medicare Other

## 2023-07-23 ENCOUNTER — Inpatient Hospital Stay: Payer: Medicare Other | Attending: Oncology

## 2023-07-23 ENCOUNTER — Inpatient Hospital Stay (HOSPITAL_BASED_OUTPATIENT_CLINIC_OR_DEPARTMENT_OTHER): Payer: Medicare Other | Admitting: Oncology

## 2023-07-23 VITALS — BP 133/74 | HR 89 | Temp 95.4°F | Resp 18 | Ht 61.0 in | Wt 177.2 lb

## 2023-07-23 DIAGNOSIS — Z87891 Personal history of nicotine dependence: Secondary | ICD-10-CM | POA: Insufficient documentation

## 2023-07-23 DIAGNOSIS — Z809 Family history of malignant neoplasm, unspecified: Secondary | ICD-10-CM | POA: Insufficient documentation

## 2023-07-23 DIAGNOSIS — Z7984 Long term (current) use of oral hypoglycemic drugs: Secondary | ICD-10-CM | POA: Insufficient documentation

## 2023-07-23 DIAGNOSIS — M129 Arthropathy, unspecified: Secondary | ICD-10-CM | POA: Diagnosis not present

## 2023-07-23 DIAGNOSIS — Z79899 Other long term (current) drug therapy: Secondary | ICD-10-CM | POA: Insufficient documentation

## 2023-07-23 DIAGNOSIS — Z794 Long term (current) use of insulin: Secondary | ICD-10-CM | POA: Diagnosis not present

## 2023-07-23 DIAGNOSIS — K219 Gastro-esophageal reflux disease without esophagitis: Secondary | ICD-10-CM | POA: Diagnosis not present

## 2023-07-23 DIAGNOSIS — Z7982 Long term (current) use of aspirin: Secondary | ICD-10-CM | POA: Diagnosis not present

## 2023-07-23 DIAGNOSIS — Z86711 Personal history of pulmonary embolism: Secondary | ICD-10-CM | POA: Insufficient documentation

## 2023-07-23 DIAGNOSIS — E119 Type 2 diabetes mellitus without complications: Secondary | ICD-10-CM | POA: Insufficient documentation

## 2023-07-23 DIAGNOSIS — D509 Iron deficiency anemia, unspecified: Secondary | ICD-10-CM

## 2023-07-23 DIAGNOSIS — I10 Essential (primary) hypertension: Secondary | ICD-10-CM | POA: Insufficient documentation

## 2023-07-23 DIAGNOSIS — D508 Other iron deficiency anemias: Secondary | ICD-10-CM

## 2023-07-23 LAB — IRON AND TIBC
Iron: 46 ug/dL (ref 28–170)
Saturation Ratios: 19 % (ref 10.4–31.8)
TIBC: 239 ug/dL — ABNORMAL LOW (ref 250–450)
UIBC: 193 ug/dL

## 2023-07-23 LAB — CBC
HCT: 35.3 % — ABNORMAL LOW (ref 36.0–46.0)
Hemoglobin: 11.7 g/dL — ABNORMAL LOW (ref 12.0–15.0)
MCH: 28.5 pg (ref 26.0–34.0)
MCHC: 33.1 g/dL (ref 30.0–36.0)
MCV: 86.1 fL (ref 80.0–100.0)
Platelets: 203 10*3/uL (ref 150–400)
RBC: 4.1 MIL/uL (ref 3.87–5.11)
RDW: 14.1 % (ref 11.5–15.5)
WBC: 6.6 10*3/uL (ref 4.0–10.5)
nRBC: 0 % (ref 0.0–0.2)

## 2023-07-23 LAB — FERRITIN: Ferritin: 74 ng/mL (ref 11–307)

## 2023-07-29 ENCOUNTER — Encounter: Payer: Self-pay | Admitting: Oncology

## 2023-07-29 NOTE — Progress Notes (Signed)
Hematology/Oncology Consult note [facility]  Telephone:([phone] [phone]  Patient Care Team: Barbette Reichmann, MD as PCP - General (Internal Medicine) Creig Hines, MD as Consulting Physician (Oncology)   Name of the patient: Jillian Moore  [id number]  [birth year]   Date of visit: 07/29/23  Diagnosis-iron deficiency anemia  Chief complaint/ Reason for visit- routine f/u of iron deficiency anemia  Heme/Onc history: patient is a 89 year old female with a past medical history significant for GERD hypertension type 2 diabetes and history of PE back in 2018.  She has been referred for anemia.  Most recent CBC from 10/17/2022 showed H&H of 9.7/31.1 with an MCV of 79.5 white count and platelets were normal.  Ferritin levels were low at 7.  B12 levels were normal at greater than 1500.  TSH was normal.  Looking back at her prior CBCs patient's hemoglobin has been between 11-12 up until March 2023.  In July 2023 it dropped down to 9.8 patient has seen Dr. Mia Creek from GI for iron deficiency anemia risks and benefits of endoscopic evaluation were discussed with the patient.  Given her age endoscopy evaluation was not pursued.      Interval history-patient is doing well for her age and denies any specific complaints at this time.  Denies any blood loss in her stool or urine.  ECOG PS- 1 Pain scale- 0   Review of systems- Review of Systems  Constitutional:  Negative for chills, fever, malaise/fatigue and weight loss.  HENT:  Negative for congestion, ear discharge and nosebleeds.   Eyes:  Negative for blurred vision.  Respiratory:  Negative for cough, hemoptysis, sputum production, shortness of breath and wheezing.   Cardiovascular:  Negative for chest pain, palpitations, orthopnea and claudication.  Gastrointestinal:  Negative for abdominal pain, blood in stool, constipation, diarrhea, heartburn, melena, nausea and vomiting.  Genitourinary:  Negative  for dysuria, flank pain, frequency, hematuria and urgency.  Musculoskeletal:  Negative for back pain, joint pain and myalgias.  Skin:  Negative for rash.  Neurological:  Negative for dizziness, tingling, focal weakness, seizures, weakness and headaches.  Endo/Heme/Allergies:  Does not bruise/bleed easily.  Psychiatric/Behavioral:  Negative for depression and suicidal ideas. The patient does not have insomnia.       Allergies  Allergen Reactions   Meloxicam Other (See Comments)    Dizziness   Penicillins Rash     Past Medical History:  Diagnosis Date   Arthritis    left knee   Diabetes mellitus without complication ([facility])    Patient takes Metformin and Insulin   GERD (gastroesophageal reflux disease)    Hypertension    Pulmonary emboli ([facility]) 12/2016   Wears dentures    full upper, partial lower     Past Surgical History:  Procedure Laterality Date   ABDOMINAL HYSTERECTOMY     CATARACT EXTRACTION W/PHACO Right 09/18/2017   Procedure: CATARACT EXTRACTION PHACO AND INTRAOCULAR LENS PLACEMENT ([facility])  DIABETIC RIGHT;  Surgeon: Nevada Crane, MD;  Location: [facility];  Service: Ophthalmology;  Laterality: Right;  Diabetic - insulin and oral meds   CHOLECYSTECTOMY      Social History   Socioeconomic History   Marital status: Widowed    Spouse name: Not on file   Number of children: Not on file   Years of education: Not on file   Highest education level: Not on file  Occupational History   Not on file  Tobacco Use   Smoking status: Former  Current packs/day: 0.00    Types: Cigarettes    Quit date: 1970    Years since quitting: 54.6   Smokeless tobacco: Never  Vaping Use   Vaping status: Never Used  Substance and Sexual Activity   Alcohol use: No   Drug use: No   Sexual activity: Not Currently  Other Topics Concern   Not on file  Social History Narrative   Not on file   Social Determinants of Health   Financial Resource Strain: Low Risk   (07/03/2023)   Received from [facility]   Overall Financial Resource Strain (CARDIA)    Difficulty of Paying Living Expenses: Not hard at all  Food Insecurity: No Food Insecurity (07/03/2023)   Received from [facility]   Hunger Vital Sign    Worried About Running Out of Food in the Last Year: Never true    Ran Out of Food in the Last Year: Never true  Transportation Needs: No Transportation Needs (07/03/2023)   Received from [facility] - Transportation    In the past 12 months, has lack of transportation kept you from medical appointments or from getting medications?: No    Lack of Transportation (Non-Medical): No  Physical Activity: Not on file  Stress: Not on file  Social Connections: Not on file  Intimate Partner Violence: Not on file    Family History  Problem Relation Age of Onset   Dementia Mother    Diabetes Father    Cancer Paternal Aunt      Current Outpatient Medications:    amLODipine (NORVASC) 2.5 MG tablet, Take by mouth., Disp: , Rfl:    aspirin EC 81 MG tablet, Take 81 mg by mouth daily., Disp: , Rfl:    escitalopram (LEXAPRO) 5 MG tablet, Take by mouth., Disp: , Rfl:    GNP EASY TOUCH GLUCOSE TEST test strip, 2 (two) times daily. , Disp: , Rfl: 1   Insulin Aspart FlexPen (NOVOLOG) 100 UNIT/ML, Inject 12 units before the LUNCH meal only, Disp: , Rfl:    LANTUS SOLOSTAR 100 UNIT/ML Solostar Pen, Inject 20 Units into the skin at bedtime. , Disp: , Rfl:    memantine (NAMENDA) 10 MG tablet, Take 5mg  in the morning and 10mg  at night for one week, then increase to 10mg  twice daily., Disp: , Rfl:    metFORMIN (GLUCOPHAGE) 500 MG tablet, Take 500 mg by mouth 2 (two) times daily., Disp: , Rfl:    metoprolol tartrate (LOPRESSOR) 50 MG tablet, Take 50 mg by mouth 2 (two) times daily., Disp: , Rfl:    omeprazole (PRILOSEC OTC) 20 MG tablet, Take by mouth., Disp: , Rfl:    rosuvastatin (CRESTOR) 10 MG tablet,  Take 10 mg by mouth daily., Disp: , Rfl:    rosuvastatin (CRESTOR) 10 MG tablet, Take 1 tablet by mouth daily., Disp: , Rfl:    docusate sodium (DOK) 100 MG capsule, Take 100 mg by mouth 2 (two) times daily. (Patient not taking: Reported on 07/23/2023), Disp: , Rfl:    iron polysaccharides (NIFEREX) 150 MG capsule, Take 150 mg by mouth daily. (Patient not taking: Reported on 07/23/2023), Disp: , Rfl:    senna (SENNA-TIME) 8.6 MG tablet, Take 2 tablets by mouth daily. (Patient not taking: Reported on 07/23/2023), Disp: , Rfl:   Physical exam:  Vitals:   07/23/23 1003  BP: 133/74  Pulse: 89  Resp: 18  Temp: (!) 95.4 F (35.2 C)  TempSrc: Tympanic  SpO2: 99%  Weight: 177 lb 3.2 oz (80.4 kg)  Height: 5\' 1"  (1.549 m)   Physical Exam Cardiovascular:     Rate and Rhythm: Normal rate and regular rhythm.     Heart sounds: Normal heart sounds.  Pulmonary:     Effort: Pulmonary effort is normal.     Breath sounds: Normal breath sounds.  Skin:    General: Skin is warm and dry.  Neurological:     Mental Status: She is alert and oriented to person, place, and time.         Latest Ref Rng & Units 11/11/2021   11:57 AM  CMP  Glucose 70 - 99 mg/dL 811   BUN 8 - 23 mg/dL 18   Creatinine 9.14 - 1.00 mg/dL 7.82   Sodium 956 - 213 mmol/L 136   Potassium 3.5 - 5.1 mmol/L 3.6   Chloride 98 - 111 mmol/L 100   CO2 22 - 32 mmol/L 26   Calcium 8.9 - 10.3 mg/dL 9.2       Latest Ref Rng & Units 07/23/2023    9:44 AM  CBC  WBC 4.0 - 10.5 K/uL 6.6   Hemoglobin 12.0 - 15.0 g/dL 08.6   Hematocrit 57.8 - 46.0 % 35.3   Platelets 150 - 400 K/uL 203      Assessment and plan- Patient is a 89 y.o. female here for routine follow-up of iron deficiency Anemia   Patient's hemoglobin is 11.7 presently and overall remained stable around that range for the last 2 years.  Ferritin levels are normal at 74 with normal iron saturation ratio of 19%.  Patient does not require any IV iron at this time.  Repeat CBC  ferritin and iron studies in 3 and 6 months and I will see her back in 6 months   Visit Diagnosis 1. Iron deficiency anemia, unspecified iron deficiency anemia type      Dr. Owens Shark, MD, MPH [facility] [phone] 07/29/2023 11:42 AM

## 2023-08-09 ENCOUNTER — Emergency Department: Payer: Medicare Other

## 2023-08-09 ENCOUNTER — Encounter: Payer: Self-pay | Admitting: Oncology

## 2023-08-09 ENCOUNTER — Other Ambulatory Visit: Payer: Self-pay

## 2023-08-09 ENCOUNTER — Emergency Department
Admission: EM | Admit: 2023-08-09 | Discharge: 2023-08-09 | Disposition: A | Discharge status: Home / Self Care | Payer: Medicare Other | Attending: Emergency Medicine | Admitting: Emergency Medicine

## 2023-08-09 DIAGNOSIS — F0390 Unspecified dementia without behavioral disturbance: Secondary | ICD-10-CM | POA: Diagnosis not present

## 2023-08-09 DIAGNOSIS — W19XXXA Unspecified fall, initial encounter: Secondary | ICD-10-CM | POA: Insufficient documentation

## 2023-08-09 DIAGNOSIS — M436 Torticollis: Secondary | ICD-10-CM | POA: Insufficient documentation

## 2023-08-09 DIAGNOSIS — I10 Essential (primary) hypertension: Secondary | ICD-10-CM | POA: Diagnosis not present

## 2023-08-09 DIAGNOSIS — E119 Type 2 diabetes mellitus without complications: Secondary | ICD-10-CM | POA: Diagnosis not present

## 2023-08-09 NOTE — ED Provider Notes (Signed)
[facility] Provider Note    Event Date/Time   First MD Initiated Contact with Patient 08/09/23 1408     (approximate)   History   Fall   HPI  Jillian Moore is a 89 y.o. female with PMH of diabetes, hypertension and arthritis who presents for evaluation after a fall earlier today.  Patient was getting up on a barstool to eat breakfast when she fell backwards and hit her head.  Patient endorses some neck stiffness.  She denies headache, vision changes, dizziness, nausea.  Patient does have dementia at baseline.  She does not take blood thinners.     Physical Exam   Triage Vital Signs: ED Triage Vitals  Encounter Vitals Group     BP 08/09/23 1355 (!) 142/77     Systolic BP Percentile --      Diastolic BP Percentile --      Pulse Rate 08/09/23 1355 100     Resp 08/09/23 1355 16     Temp 08/09/23 1355 98.8 F (37.1 C)     Temp src --      SpO2 08/09/23 1355 95 %     Weight 08/09/23 1353 179 lb (81.2 kg)     Height 08/09/23 1353 5' (1.524 m)     Head Circumference --      Peak Flow --      Pain Score --      Pain Loc --      Pain Education --      Exclude from Growth Chart --     Most recent vital signs: Vitals:   08/09/23 1355  BP: (!) 142/77  Pulse: 100  Resp: 16  Temp: 98.8 F (37.1 C)  SpO2: 95%   General: Awake, no distress.  CV:  Good peripheral perfusion.  RRR. Resp:  Normal effort.  CTAB. Abd:  No distention.  Other:  PERRL.  EOM intact.  No ataxia.  No focal neurodeficits.   ED Results / Procedures / Treatments   Labs (all labs ordered are listed, but only abnormal results are displayed) Labs Reviewed - No data to display   RADIOLOGY   CT head and neck obtained, I interpreted the images as well as reviewed the radiologist report.  Images were negative for any acute intracranial pathology.  No traumatic alignment of the cervical spine or fractures.    PROCEDURES:  Critical Care performed:  No  Procedures   MEDICATIONS ORDERED IN ED: Medications - No data to display   IMPRESSION / MDM / ASSESSMENT AND PLAN / ED COURSE  I reviewed the triage vital signs and the nursing notes.                             89 year old female presents for evaluation of neck pain after a fall earlier today. Differential diagnosis includes, but is not limited to, muscle strain, concussion, intracranial bleed, fracture.  Patient's presentation is most consistent with acute complicated illness / injury requiring diagnostic workup.  CT head and neck obtained, interpreted the images as well as reviewed the radiologist report.  No acute intracranial pathology.  No traumatic alignment of the cervical spine or fractures.  Given patient's negative imaging and lack of symptoms other than the neck pain, I feel she is appropriate for outpatient management.  I advised patient to take Tylenol and ibuprofen as needed for pain.  She can return to the ED for any new  or worsening symptoms.  Patient voiced understanding, all questions were answered and she was stable at discharge.      FINAL CLINICAL IMPRESSION(S) / ED DIAGNOSES   Final diagnoses:  Fall, initial encounter     Rx / DC Orders   ED Discharge Orders     None        Note:  This document was prepared using Dragon voice recognition software and may include unintentional dictation errors.   Cameron Ali, PA-C 08/09/23 1511    Chesley Noon, MD 08/09/23 [phone]

## 2023-08-09 NOTE — ED Triage Notes (Signed)
Pt to ED With daughter for fall this am while trying to get on bar stool. No blood thinners. Pt initially denies complaints, family reports pt has been c/o neck stiffness. Dementia at baseline

## 2023-08-09 NOTE — ED Notes (Signed)
Pt to CT at this time.

## 2023-08-09 NOTE — ED Notes (Signed)
Pt here for fall this morning. No LOC, no thinners, c/o neck stiffness. Daughter and caregiver at bedside.

## 2023-08-09 NOTE — Discharge Instructions (Addendum)
Your CT scans were normal today.  You can take Tylenol or ibuprofen as needed for your neck pain.  Please return to the ED with any new or worsening symptoms.

## 2023-10-16 ENCOUNTER — Encounter: Payer: Self-pay | Admitting: Oncology

## 2023-10-23 ENCOUNTER — Inpatient Hospital Stay: Payer: Medicare Other | Attending: Oncology

## 2023-10-23 DIAGNOSIS — D509 Iron deficiency anemia, unspecified: Secondary | ICD-10-CM | POA: Diagnosis present

## 2023-10-23 LAB — CBC
HCT: 32.6 % — ABNORMAL LOW (ref 36.0–46.0)
Hemoglobin: 10.7 g/dL — ABNORMAL LOW (ref 12.0–15.0)
MCH: 28.3 pg (ref 26.0–34.0)
MCHC: 32.8 g/dL (ref 30.0–36.0)
MCV: 86.2 fL (ref 80.0–100.0)
Platelets: 212 10*3/uL (ref 150–400)
RBC: 3.78 MIL/uL — ABNORMAL LOW (ref 3.87–5.11)
RDW: 15.6 % — ABNORMAL HIGH (ref 11.5–15.5)
WBC: 6.9 10*3/uL (ref 4.0–10.5)
nRBC: 0 % (ref 0.0–0.2)

## 2023-10-23 LAB — IRON AND TIBC
Iron: 41 ug/dL (ref 28–170)
Saturation Ratios: 14 % (ref 10.4–31.8)
TIBC: 298 ug/dL (ref 250–450)
UIBC: 257 ug/dL

## 2023-10-23 LAB — FERRITIN: Ferritin: 25 ng/mL (ref 11–307)

## 2023-11-20 ENCOUNTER — Emergency Department
Admission: EM | Admit: 2023-11-20 | Discharge: 2023-11-20 | Disposition: A | Discharge status: Home / Self Care | Payer: Medicare Other | Attending: Emergency Medicine | Admitting: Emergency Medicine

## 2023-11-20 ENCOUNTER — Other Ambulatory Visit: Payer: Self-pay

## 2023-11-20 ENCOUNTER — Encounter: Payer: Self-pay | Admitting: Emergency Medicine

## 2023-11-20 DIAGNOSIS — R739 Hyperglycemia, unspecified: Secondary | ICD-10-CM | POA: Diagnosis present

## 2023-11-20 DIAGNOSIS — Z794 Long term (current) use of insulin: Secondary | ICD-10-CM | POA: Diagnosis not present

## 2023-11-20 DIAGNOSIS — I10 Essential (primary) hypertension: Secondary | ICD-10-CM | POA: Diagnosis not present

## 2023-11-20 DIAGNOSIS — R8290 Unspecified abnormal findings in urine: Secondary | ICD-10-CM | POA: Diagnosis not present

## 2023-11-20 DIAGNOSIS — E1165 Type 2 diabetes mellitus with hyperglycemia: Secondary | ICD-10-CM | POA: Diagnosis not present

## 2023-11-20 DIAGNOSIS — N390 Urinary tract infection, site not specified: Secondary | ICD-10-CM

## 2023-11-20 LAB — CBG MONITORING, ED
Glucose-Capillary: 387 mg/dL — ABNORMAL HIGH (ref 70–99)
Glucose-Capillary: 252 mg/dL — ABNORMAL HIGH (ref 70–99)

## 2023-11-20 LAB — CBC
HCT: 32.7 % — ABNORMAL LOW (ref 36.0–46.0)
Hemoglobin: 11 g/dL — ABNORMAL LOW (ref 12.0–15.0)
MCH: 27.5 pg (ref 26.0–34.0)
MCHC: 33.6 g/dL (ref 30.0–36.0)
MCV: 81.8 fL (ref 80.0–100.0)
Platelets: 257 10*3/uL (ref 150–400)
RBC: 4 MIL/uL (ref 3.87–5.11)
RDW: 15.8 % — ABNORMAL HIGH (ref 11.5–15.5)
WBC: 11.8 10*3/uL — ABNORMAL HIGH (ref 4.0–10.5)
nRBC: 0 % (ref 0.0–0.2)

## 2023-11-20 LAB — URINALYSIS, ROUTINE W REFLEX MICROSCOPIC
Bilirubin Urine: NEGATIVE
Glucose, UA: >=500 mg/dL — AB
Hgb urine dipstick: NEGATIVE
Ketones, ur: NEGATIVE mg/dL
Nitrite: NEGATIVE
Protein, ur: 30 mg/dL — AB
Specific Gravity, Urine: 1.025 (ref 1.005–1.030)
pH: 5 (ref 5.0–8.0)

## 2023-11-20 LAB — BASIC METABOLIC PANEL
Anion gap: 11 (ref 5–15)
BUN: 35 mg/dL — ABNORMAL HIGH (ref 8–23)
CO2: 20 mmol/L — ABNORMAL LOW (ref 22–32)
Calcium: 9.1 mg/dL (ref 8.9–10.3)
Chloride: 102 mmol/L (ref 98–111)
Creatinine, Ser: 1.31 mg/dL — ABNORMAL HIGH (ref 0.44–1.00)
GFR, Estimated: 38 mL/min — ABNORMAL LOW (ref >60)
Glucose, Bld: 375 mg/dL — ABNORMAL HIGH (ref 70–99)
Potassium: 4.4 mmol/L (ref 3.5–5.1)
Sodium: 133 mmol/L — ABNORMAL LOW (ref 135–145)

## 2023-11-20 LAB — LIPASE, BLOOD: Lipase: 31 U/L (ref 11–51)

## 2023-11-20 MED ORDER — SODIUM CHLORIDE 0.9 % IV BOLUS
1000.0000 mL | Freq: Once | INTRAVENOUS | Status: AC
  Administered 2023-11-20: 1000 mL via INTRAVENOUS

## 2023-11-20 MED ORDER — INSULIN ASPART 100 UNIT/ML IJ SOLN
8.0000 [IU] | Freq: Once | INTRAMUSCULAR | Status: AC
  Administered 2023-11-20: 8 [IU] via INTRAVENOUS
  Filled 2023-11-20: qty 1

## 2023-11-20 MED ORDER — SODIUM CHLORIDE 0.9 % IV SOLN
1.0000 g | Freq: Once | INTRAVENOUS | Status: AC
  Administered 2023-11-20: 1 g via INTRAVENOUS
  Filled 2023-11-20: qty 10

## 2023-11-20 MED ORDER — CEPHALEXIN 500 MG PO CAPS: 500.0000 mg | ORAL_CAPSULE | Freq: Two times a day (BID) | ORAL | 0 refills | Status: AC

## 2023-11-20 NOTE — ED Triage Notes (Signed)
Patient to ED via POV for hyperglycemia. Takes insulin at home- ran in the 200's yesterday and today was in the 400's. C/o some abd pain that started today. States had cortisone shot in left knee yesterday.  387 cbg in triage.

## 2023-11-20 NOTE — ED Provider Notes (Signed)
[facility] Provider Note    Event Date/Time   First MD Initiated Contact with Patient 11/20/23 1956     (approximate)  History   Chief Complaint: Hyperglycemia  HPI  Jillian Moore is a 89 y.o. female with a past medical history of diabetes, gastric reflux, hypertension, presents to the emergency department for elevated blood sugar.  According to the patient and the daughter patient takes Humalog insulin 10 units each day before lunch and then takes 12 units of Lantus at night.  Patient had a steroid injection in her knee yesterday and since then they have noticed that her blood sugar has been higher and earlier today was 426 at home so they brought the patient to the emergency department for evaluation.  Patient denies any acute symptoms.  Denies any abdominal pain chest pain vomiting or diarrhea.  No fever.  No urinary symptoms.  Patient states otherwise she feels well.  Physical Exam   Triage Vital Signs: ED Triage Vitals [11/20/23 1736]  Encounter Vitals Group     BP 122/65     Systolic BP Percentile      Diastolic BP Percentile      Pulse Rate (!) 105     Resp 18     Temp 98.3 F (36.8 C)     Temp Source Oral     SpO2 95 %     Weight 180 lb (81.6 kg)     Height 5' (1.524 m)     Head Circumference      Peak Flow      Pain Score 4     Pain Loc      Pain Education      Exclude from Growth Chart     Most recent vital signs: Vitals:   11/20/23 1736  BP: 122/65  Pulse: (!) 105  Resp: 18  Temp: 98.3 F (36.8 C)  SpO2: 95%    General: Awake, no distress.  CV:  Good peripheral perfusion.  Regular rate and rhythm  Resp:  Normal effort.  Equal breath sounds bilaterally.  Abd:  No distention.  Soft, nontender.  No rebound or guarding.  ED Results / Procedures / Treatments   MEDICATIONS ORDERED IN ED: Medications  sodium chloride 0.9 % bolus 1,000 mL (has no administration in time range)  insulin aspart (novoLOG) injection 8 Units (has no  administration in time range)     IMPRESSION / MDM / ASSESSMENT AND PLAN / ED COURSE  I reviewed the triage vital signs and the nursing notes.  Patient's presentation is most consistent with acute presentation with potential threat to life or bodily function.  Patient presents to the emergency department for hyperglycemia.  Patient's blood glucose in the emergency department is 387 on fingerstick 375 on her chemistry.  The remainder of the chemistry is reassuring anion gap of 11 CBC is reassuring.  Suspect the steroid injection yesterday could be affecting the patient's blood sugar somewhat.  Will dose a liter of normal saline as well as 8 units of insulin to bring the patient's blood sugar down.  We will check a urinalysis to rule out urinary tract infection and continue to closely monitor.  Overall the patient appears very well for her age and she has no complaints at this time.  Patient's urinalysis shows many bacteria with 11-20 white cells which is likely also contributing to her hyperglycemia.  Extensive urine culture on the urinalysis.  Patient received IV Rocephin.  Will discharge with  Keflex twice daily x 7 days.  Discussed with the patient and daughter to continue taking the patient's home insulin as prescribed by her doctor and follow-up with her PCP in the next 1 to 2 days for recheck/reevaluation and consideration of changing her basal insulin dosage.  Patient and daughter agreeable to plan.  FINAL CLINICAL IMPRESSION(S) / ED DIAGNOSES   Hyperglycemia  Note:  This document was prepared using Dragon voice recognition software and may include unintentional dictation errors.   Minna Antis, MD 11/20/23 2216

## 2023-11-20 NOTE — Discharge Instructions (Addendum)
Please continue to take your insulin as prescribed by your doctor.  Please follow-up with your primary care doctor within the next 1 to 2 days for recheck/reevaluation and to consider increasing your insulin dose if needed.  Please take your antibiotics as prescribed for their entire course.  Return to the emergency department for any symptom personally concerning to yourself.

## 2023-11-22 LAB — URINE CULTURE: Culture: >=100000 — AB

## 2023-12-17 ENCOUNTER — Other Ambulatory Visit: Payer: Self-pay | Admitting: Nurse Practitioner

## 2023-12-17 DIAGNOSIS — D509 Iron deficiency anemia, unspecified: Secondary | ICD-10-CM

## 2023-12-19 ENCOUNTER — Telehealth: Payer: Self-pay | Admitting: *Deleted

## 2023-12-19 ENCOUNTER — Other Ambulatory Visit: Payer: Self-pay | Admitting: *Deleted

## 2023-12-19 NOTE — Telephone Encounter (Signed)
 Called the home and her daughter -Jillian Moore says that she helps her mom for all appts. I told her that ferritin was low and she said that PCP suggested to get some iron infusion. I asked the daughter what it is a good time to get it. She wanted 1/22 and 1/27 at 1 pm. I left the message by voicemail with the appts.

## 2024-01-02 ENCOUNTER — Inpatient Hospital Stay: Payer: Medicare Other | Attending: Oncology

## 2024-01-09 ENCOUNTER — Inpatient Hospital Stay: Payer: Medicare Other | Attending: Oncology

## 2024-01-09 VITALS — BP 142/75 | HR 87 | Temp 96.9°F | Resp 18

## 2024-01-09 DIAGNOSIS — D509 Iron deficiency anemia, unspecified: Secondary | ICD-10-CM | POA: Diagnosis present

## 2024-01-09 DIAGNOSIS — Z79899 Other long term (current) drug therapy: Secondary | ICD-10-CM | POA: Insufficient documentation

## 2024-01-09 DIAGNOSIS — D508 Other iron deficiency anemias: Secondary | ICD-10-CM

## 2024-01-09 MED ORDER — SODIUM CHLORIDE 0.9 % IV SOLN
510.0000 mg | Freq: Once | INTRAVENOUS | Status: AC
  Administered 2024-01-09: 510 mg via INTRAVENOUS
  Filled 2024-01-09: qty 510

## 2024-01-09 MED ORDER — SODIUM CHLORIDE 0.9 % IV SOLN
Freq: Once | INTRAVENOUS | Status: AC
Start: 2024-01-09 — End: 2024-01-09
  Filled 2024-01-09: qty 250

## 2024-01-16 ENCOUNTER — Inpatient Hospital Stay: Payer: Medicare Other | Attending: Oncology

## 2024-01-16 VITALS — BP 155/63 | HR 87 | Temp 96.9°F | Resp 18

## 2024-01-16 DIAGNOSIS — K219 Gastro-esophageal reflux disease without esophagitis: Secondary | ICD-10-CM | POA: Insufficient documentation

## 2024-01-16 DIAGNOSIS — Z809 Family history of malignant neoplasm, unspecified: Secondary | ICD-10-CM | POA: Diagnosis not present

## 2024-01-16 DIAGNOSIS — E119 Type 2 diabetes mellitus without complications: Secondary | ICD-10-CM | POA: Insufficient documentation

## 2024-01-16 DIAGNOSIS — Z87891 Personal history of nicotine dependence: Secondary | ICD-10-CM | POA: Diagnosis not present

## 2024-01-16 DIAGNOSIS — Z7982 Long term (current) use of aspirin: Secondary | ICD-10-CM | POA: Diagnosis not present

## 2024-01-16 DIAGNOSIS — Z79899 Other long term (current) drug therapy: Secondary | ICD-10-CM | POA: Insufficient documentation

## 2024-01-16 DIAGNOSIS — Z794 Long term (current) use of insulin: Secondary | ICD-10-CM | POA: Diagnosis not present

## 2024-01-16 DIAGNOSIS — I10 Essential (primary) hypertension: Secondary | ICD-10-CM | POA: Insufficient documentation

## 2024-01-16 DIAGNOSIS — Z86711 Personal history of pulmonary embolism: Secondary | ICD-10-CM | POA: Diagnosis not present

## 2024-01-16 DIAGNOSIS — D508 Other iron deficiency anemias: Secondary | ICD-10-CM

## 2024-01-16 DIAGNOSIS — D509 Iron deficiency anemia, unspecified: Secondary | ICD-10-CM | POA: Diagnosis present

## 2024-01-16 DIAGNOSIS — Z7984 Long term (current) use of oral hypoglycemic drugs: Secondary | ICD-10-CM | POA: Insufficient documentation

## 2024-01-16 MED ORDER — SODIUM CHLORIDE 0.9 % IV SOLN
Freq: Once | INTRAVENOUS | Status: AC
Start: 2024-01-16 — End: 2024-01-16
  Filled 2024-01-16: qty 250

## 2024-01-16 MED ORDER — SODIUM CHLORIDE 0.9 % IV SOLN
510.0000 mg | Freq: Once | INTRAVENOUS | Status: AC
  Administered 2024-01-16: 510 mg via INTRAVENOUS
  Filled 2024-01-16: qty 510

## 2024-01-23 ENCOUNTER — Encounter: Payer: Self-pay | Admitting: Oncology

## 2024-01-23 ENCOUNTER — Inpatient Hospital Stay (HOSPITAL_BASED_OUTPATIENT_CLINIC_OR_DEPARTMENT_OTHER): Payer: Medicare Other | Admitting: Oncology

## 2024-01-23 ENCOUNTER — Inpatient Hospital Stay: Payer: Medicare Other

## 2024-01-23 VITALS — BP 148/62 | HR 88 | Temp 96.2°F | Resp 19 | Wt 174.6 lb

## 2024-01-23 DIAGNOSIS — D509 Iron deficiency anemia, unspecified: Secondary | ICD-10-CM | POA: Diagnosis not present

## 2024-01-23 DIAGNOSIS — D508 Other iron deficiency anemias: Secondary | ICD-10-CM | POA: Diagnosis not present

## 2024-01-23 LAB — FERRITIN: Ferritin: 1085 ng/mL — ABNORMAL HIGH (ref 11–307)

## 2024-01-23 LAB — CBC
HCT: 37.1 % (ref 36.0–46.0)
Hemoglobin: 12.2 g/dL (ref 12.0–15.0)
MCH: 27.1 pg (ref 26.0–34.0)
MCHC: 32.9 g/dL (ref 30.0–36.0)
MCV: 82.3 fL (ref 80.0–100.0)
Platelets: 186 10*3/uL (ref 150–400)
RBC: 4.51 MIL/uL (ref 3.87–5.11)
RDW: 20.6 % — ABNORMAL HIGH (ref 11.5–15.5)
WBC: 7 10*3/uL (ref 4.0–10.5)
nRBC: 0 % (ref 0.0–0.2)

## 2024-01-23 LAB — IRON AND TIBC
Iron: 86 ug/dL (ref 28–170)
Saturation Ratios: 35 % — ABNORMAL HIGH (ref 10.4–31.8)
TIBC: 249 ug/dL — ABNORMAL LOW (ref 250–450)
UIBC: 163 ug/dL

## 2024-01-23 NOTE — Progress Notes (Unsigned)
Hematology/Oncology Consult note [facility]  [phone] [phone]  Patient Care Team: Barbette Reichmann, MD as PCP - General (Internal Medicine) Creig Hines, MD as Consulting Physician (Oncology)   Name of the patient: Jillian Moore  [id number]  01/29/1926   Date of visit: 01/23/24  Diagnosis- iron deficiency anemia  Chief complaint/ Reason for visit- routine f/u of iron deficiency anemia  Heme/Onc history:  patient is a 89 year old female with a past medical history significant for GERD hypertension type 2 diabetes and history of PE back in 2018.  She has been referred for anemia.  Most recent CBC from 10/17/2022 showed H&H of 9.7/31.1 with an MCV of 79.5 white count and platelets were normal.  Ferritin levels were low at 7.  B12 levels were normal at greater than 1500.  TSH was normal.  Looking back at her prior CBCs patient's hemoglobin has been between 11-12 up until March 2023.  In July 2023 it dropped down to 9.8 patient has seen Dr. Mia Creek from GI for iron deficiency anemia risks and benefits of endoscopic evaluation were discussed with the patient.  Given her age endoscopy evaluation was not pursued.    Interval history-patient recently received IV iron and overall feels some improvement in her energy.  She lives by herself and is independent of her ADLs but needs assistance with IADLs.  No recent falls  ECOG PS- 2 Pain scale- 0   Review of systems- Review of Systems  Constitutional:  Positive for malaise/fatigue. Negative for chills, fever and weight loss.  HENT:  Negative for congestion, ear discharge and nosebleeds.   Eyes:  Negative for blurred vision.  Respiratory:  Negative for cough, hemoptysis, sputum production, shortness of breath and wheezing.   Cardiovascular:  Negative for chest pain, palpitations, orthopnea and claudication.  Gastrointestinal:  Negative for abdominal pain, blood in stool, constipation,  diarrhea, heartburn, melena, nausea and vomiting.  Genitourinary:  Negative for dysuria, flank pain, frequency, hematuria and urgency.  Musculoskeletal:  Negative for back pain, joint pain and myalgias.  Skin:  Negative for rash.  Neurological:  Negative for dizziness, tingling, focal weakness, seizures, weakness and headaches.  Endo/Heme/Allergies:  Does not bruise/bleed easily.  Psychiatric/Behavioral:  Negative for depression and suicidal ideas. The patient does not have insomnia.       Allergies  Allergen Reactions   Meloxicam Other (See Comments)    Dizziness   Penicillins Rash     Past Medical History:  Diagnosis Date   Arthritis    left knee   Diabetes mellitus without complication (HCC)    Patient takes Metformin and Insulin   GERD (gastroesophageal reflux disease)    Hypertension    Pulmonary emboli (HCC) 12/2016   Wears dentures    full upper, partial lower     Past Surgical History:  Procedure Laterality Date   ABDOMINAL HYSTERECTOMY     CATARACT EXTRACTION W/PHACO Right 09/18/2017   Procedure: CATARACT EXTRACTION PHACO AND INTRAOCULAR LENS PLACEMENT (IOC)  DIABETIC RIGHT;  Surgeon: Nevada Crane, MD;  Location: [facility];  Service: Ophthalmology;  Laterality: Right;  Diabetic - insulin and oral meds   CHOLECYSTECTOMY      Social History   Socioeconomic History   Marital status: Widowed    Spouse name: Not on file   Number of children: Not on file   Years of education: Not on file   Highest education level: Not on file  Occupational History   Not on  file  Tobacco Use   Smoking status: Former    Current packs/day: 0.00    Types: Cigarettes    Quit date: 1970    Years since quitting: 55.1   Smokeless tobacco: Never  Vaping Use   Vaping status: Never Used  Substance and Sexual Activity   Alcohol use: No   Drug use: No   Sexual activity: Not Currently  Other Topics Concern   Not on file  Social History Narrative   Not on file    Social Drivers of Health   Financial Resource Strain: Low Risk  (12/04/2023)   Received from [facility]   Overall Financial Resource Strain (CARDIA)    Difficulty of Paying Living Expenses: Not hard at all  Food Insecurity: No Food Insecurity (12/04/2023)   Received from [facility]   Hunger Vital Sign    Ran Out of Food in the Last Year: Never true    Worried About Running Out of Food in the Last Year: Never true  Transportation Needs: No Transportation Needs (12/04/2023)   Received from [facility]   PRAPARE - Transportation    Lack of Transportation (Non-Medical): No    In the past 12 months, has lack of transportation kept you from medical appointments or from getting medications?: No  Physical Activity: Not on file  Stress: Not on file  Social Connections: Not on file  Intimate Partner Violence: Not on file    Family History  Problem Relation Age of Onset   Dementia Mother    Diabetes Father    Cancer Paternal Aunt      Current Outpatient Medications:    insulin lispro (HUMALOG) 100 UNIT/ML KwikPen, Inject 10 units before the LUNCH meal only, Disp: , Rfl:    metoCLOPramide (REGLAN) 10 MG tablet, Take 10 mg by mouth 3 (three) times daily., Disp: , Rfl:    neomycin-polymyxin b-dexamethasone (MAXITROL) 3.5-10000-0.1 SUSP, , Disp: , Rfl:    amLODipine (NORVASC) 2.5 MG tablet, Take by mouth., Disp: , Rfl:    aspirin EC 81 MG tablet, Take 81 mg by mouth daily., Disp: , Rfl:    cephALEXin (KEFLEX) 500 MG capsule, Take 1 capsule (500 mg total) by mouth 2 (two) times daily. (Patient not taking: Reported on 01/23/2024), Disp: 14 capsule, Rfl: 0   docusate sodium (DOK) 100 MG capsule, Take 100 mg by mouth 2 (two) times daily. (Patient not taking: Reported on 07/23/2023), Disp: , Rfl:    escitalopram (LEXAPRO) 5 MG tablet, Take by mouth., Disp: , Rfl:    GNP EASY TOUCH GLUCOSE TEST test strip, 2 (two) times daily. , Disp: , Rfl:  1   Insulin Aspart FlexPen (NOVOLOG) 100 UNIT/ML, Inject 12 units before the LUNCH meal only (Patient not taking: Reported on 01/23/2024), Disp: , Rfl:    iron polysaccharides (NIFEREX) 150 MG capsule, Take 150 mg by mouth daily. (Patient not taking: Reported on 07/23/2023), Disp: , Rfl:    LANTUS SOLOSTAR 100 UNIT/ML Solostar Pen, Inject 20 Units into the skin at bedtime. , Disp: , Rfl:    memantine (NAMENDA) 10 MG tablet, Take 5mg  in the morning and 10mg  at night for one week, then increase to 10mg  twice daily., Disp: , Rfl:    metFORMIN (GLUCOPHAGE) 500 MG tablet, Take 500 mg by mouth 2 (two) times daily., Disp: , Rfl:    metoprolol tartrate (LOPRESSOR) 50 MG tablet, Take 50 mg by mouth 2 (two) times daily. (Patient not taking: Reported  on 01/23/2024), Disp: , Rfl:    omeprazole (PRILOSEC OTC) 20 MG tablet, Take by mouth., Disp: , Rfl:    rosuvastatin (CRESTOR) 10 MG tablet, Take 10 mg by mouth daily., Disp: , Rfl:    rosuvastatin (CRESTOR) 10 MG tablet, Take 1 tablet by mouth daily., Disp: , Rfl:    senna (SENNA-TIME) 8.6 MG tablet, Take 2 tablets by mouth daily. (Patient not taking: Reported on 07/23/2023), Disp: , Rfl:    TRADJENTA 5 MG TABS tablet, Take 5 mg by mouth daily., Disp: , Rfl:   Physical exam:  Vitals:   01/23/24 1339  BP: (!) 148/62  Pulse: 88  Resp: 19  Temp: (!) 96.2 F (35.7 C)  TempSrc: Tympanic  SpO2: 99%  Weight: 174 lb 9.6 oz (79.2 kg)   Physical Exam Constitutional:      Comments: Sitting in a wheelchair.  Appears in no acute distress  Cardiovascular:     Rate and Rhythm: Normal rate and regular rhythm.     Heart sounds: Normal heart sounds.  Pulmonary:     Effort: Pulmonary effort is normal.     Breath sounds: Normal breath sounds.  Abdominal:     General: Bowel sounds are normal.     Palpations: Abdomen is soft.  Skin:    General: Skin is warm and dry.  Neurological:     Mental Status: She is alert and oriented to person, place, and time.          Latest Ref Rng & Units 11/20/2023    5:38 PM  CMP  Glucose 70 - 99 mg/dL 829   BUN 8 - 23 mg/dL 35   Creatinine 5.62 - 1.00 mg/dL 1.30   Sodium 865 - 784 mmol/L 133   Potassium 3.5 - 5.1 mmol/L 4.4   Chloride 98 - 111 mmol/L 102   CO2 22 - 32 mmol/L 20   Calcium 8.9 - 10.3 mg/dL 9.1       Latest Ref Rng & Units 01/23/2024    1:00 PM  CBC  WBC 4.0 - 10.5 K/uL 7.0   Hemoglobin 12.0 - 15.0 g/dL 69.6   Hematocrit 29.5 - 46.0 % 37.1   Platelets 150 - 400 K/uL 186      Assessment and plan- Patient is a 89 y.o. female here for a routine follow-up of Iron deficiency anemia   patient received 2 doses of IV iron about 10 days ago.  Hemoglobin has improved from 11-12.2.  Ferritin levels appear elevated likely secondary to recent IV iron.  She does not require any IV iron at this time.  I will repeat CBC ferritin and iron studies in 3 and 6 months and I will see her back in 6 months.  Patient wishes to come for closer monitoring every 3 months at this time   Visit Diagnosis 1. Other iron deficiency anemia      Dr. Owens Shark, MD, MPH [facility] at [facility] [phone] 01/23/2024 1:44 PM

## 2024-01-24 ENCOUNTER — Encounter: Payer: Self-pay | Admitting: Nurse Practitioner

## 2024-04-21 ENCOUNTER — Inpatient Hospital Stay: Payer: Medicare Other | Attending: Oncology

## 2024-04-21 ENCOUNTER — Other Ambulatory Visit: Payer: Medicare Other

## 2024-04-21 DIAGNOSIS — D509 Iron deficiency anemia, unspecified: Secondary | ICD-10-CM | POA: Diagnosis present

## 2024-04-21 DIAGNOSIS — D508 Other iron deficiency anemias: Secondary | ICD-10-CM

## 2024-04-21 LAB — IRON AND TIBC
Iron: 86 ug/dL (ref 28–170)
Saturation Ratios: 36 % — ABNORMAL HIGH (ref 10.4–31.8)
TIBC: 239 ug/dL — ABNORMAL LOW (ref 250–450)
UIBC: 153 ug/dL

## 2024-04-21 LAB — FERRITIN: Ferritin: 266 ng/mL (ref 11–307)

## 2024-04-21 LAB — CBC WITH DIFFERENTIAL (CANCER CENTER ONLY)
Abs Immature Granulocytes: 0.03 10*3/uL (ref 0.00–0.07)
Basophils Absolute: 0 10*3/uL (ref 0.0–0.1)
Basophils Relative: 0 %
Eosinophils Absolute: 0 10*3/uL (ref 0.0–0.5)
Eosinophils Relative: 1 %
HCT: 37.4 % (ref 36.0–46.0)
Hemoglobin: 12.7 g/dL (ref 12.0–15.0)
Immature Granulocytes: 1 %
Lymphocytes Relative: 49 %
Lymphs Abs: 2.4 10*3/uL (ref 0.7–4.0)
MCH: 29.9 pg (ref 26.0–34.0)
MCHC: 34 g/dL (ref 30.0–36.0)
MCV: 88 fL (ref 80.0–100.0)
Monocytes Absolute: 0.3 10*3/uL (ref 0.1–1.0)
Monocytes Relative: 7 %
Neutro Abs: 2 10*3/uL (ref 1.7–7.7)
Neutrophils Relative %: 42 %
Platelet Count: 183 10*3/uL (ref 150–400)
RBC: 4.25 MIL/uL (ref 3.87–5.11)
RDW: 14.8 % (ref 11.5–15.5)
WBC Count: 4.9 10*3/uL (ref 4.0–10.5)
nRBC: 0 % (ref 0.0–0.2)

## 2024-07-21 ENCOUNTER — Other Ambulatory Visit: Payer: Self-pay

## 2024-07-21 DIAGNOSIS — D509 Iron deficiency anemia, unspecified: Secondary | ICD-10-CM

## 2024-07-22 ENCOUNTER — Encounter: Payer: Self-pay | Admitting: Oncology

## 2024-07-22 ENCOUNTER — Inpatient Hospital Stay (HOSPITAL_BASED_OUTPATIENT_CLINIC_OR_DEPARTMENT_OTHER): Payer: Medicare Other | Admitting: Oncology

## 2024-07-22 ENCOUNTER — Inpatient Hospital Stay: Payer: Medicare Other | Attending: Oncology

## 2024-07-22 VITALS — BP 142/62 | HR 84 | Temp 96.0°F | Resp 18 | Ht 60.0 in | Wt 180.4 lb

## 2024-07-22 DIAGNOSIS — Z794 Long term (current) use of insulin: Secondary | ICD-10-CM | POA: Diagnosis not present

## 2024-07-22 DIAGNOSIS — M1712 Unilateral primary osteoarthritis, left knee: Secondary | ICD-10-CM | POA: Insufficient documentation

## 2024-07-22 DIAGNOSIS — Z87891 Personal history of nicotine dependence: Secondary | ICD-10-CM | POA: Diagnosis not present

## 2024-07-22 DIAGNOSIS — I10 Essential (primary) hypertension: Secondary | ICD-10-CM | POA: Diagnosis not present

## 2024-07-22 DIAGNOSIS — Z79899 Other long term (current) drug therapy: Secondary | ICD-10-CM | POA: Diagnosis not present

## 2024-07-22 DIAGNOSIS — Z7982 Long term (current) use of aspirin: Secondary | ICD-10-CM | POA: Diagnosis not present

## 2024-07-22 DIAGNOSIS — Z86718 Personal history of other venous thrombosis and embolism: Secondary | ICD-10-CM | POA: Insufficient documentation

## 2024-07-22 DIAGNOSIS — Z7984 Long term (current) use of oral hypoglycemic drugs: Secondary | ICD-10-CM | POA: Diagnosis not present

## 2024-07-22 DIAGNOSIS — K219 Gastro-esophageal reflux disease without esophagitis: Secondary | ICD-10-CM | POA: Diagnosis not present

## 2024-07-22 DIAGNOSIS — Z86711 Personal history of pulmonary embolism: Secondary | ICD-10-CM | POA: Diagnosis not present

## 2024-07-22 DIAGNOSIS — D509 Iron deficiency anemia, unspecified: Secondary | ICD-10-CM | POA: Insufficient documentation

## 2024-07-22 DIAGNOSIS — E119 Type 2 diabetes mellitus without complications: Secondary | ICD-10-CM | POA: Insufficient documentation

## 2024-07-22 LAB — CBC WITH DIFFERENTIAL (CANCER CENTER ONLY)
Abs Immature Granulocytes: 0.02 K/uL (ref 0.00–0.07)
Basophils Absolute: 0 K/uL (ref 0.0–0.1)
Basophils Relative: 0 %
Eosinophils Absolute: 0 K/uL (ref 0.0–0.5)
Eosinophils Relative: 1 %
HCT: 36.4 % (ref 36.0–46.0)
Hemoglobin: 12.4 g/dL (ref 12.0–15.0)
Immature Granulocytes: 0 %
Lymphocytes Relative: 44 %
Lymphs Abs: 2.1 K/uL (ref 0.7–4.0)
MCH: 30.2 pg (ref 26.0–34.0)
MCHC: 34.1 g/dL (ref 30.0–36.0)
MCV: 88.8 fL (ref 80.0–100.0)
Monocytes Absolute: 0.4 K/uL (ref 0.1–1.0)
Monocytes Relative: 8 %
Neutro Abs: 2.2 K/uL (ref 1.7–7.7)
Neutrophils Relative %: 47 %
Platelet Count: 180 K/uL (ref 150–400)
RBC: 4.1 MIL/uL (ref 3.87–5.11)
RDW: 14 % (ref 11.5–15.5)
WBC Count: 4.8 K/uL (ref 4.0–10.5)
nRBC: 0 % (ref 0.0–0.2)

## 2024-07-22 LAB — IRON AND TIBC
Iron: 63 ug/dL (ref 28–170)
Saturation Ratios: 26 % (ref 10.4–31.8)
TIBC: 244 ug/dL — ABNORMAL LOW (ref 250–450)
UIBC: 181 ug/dL

## 2024-07-22 LAB — FERRITIN: Ferritin: 194 ng/mL (ref 11–307)

## 2024-07-22 NOTE — Progress Notes (Signed)
 Hematology/Oncology Consult note [facility]  [phone] [phone]  Patient Care Team: Sadie Manna, MD as PCP - General (Internal Medicine) Melanee Annah BROCKS, MD as Consulting Physician (Oncology)   Name of the patient: Jillian Moore  [id number]  01-30-1926   Date of visit: 07/22/24  Diagnosis-iron deficiency anemia  Chief complaint/ Reason for visit-routine follow-up of iron deficiency anemia  Heme/Onc history: patient is a 89 year old female with a past medical history significant for GERD hypertension type 2 diabetes and history of PE back in 2018.  She has been referred for anemia.  Most recent CBC from 10/17/2022 showed H&H of 9.7/31.1 with an MCV of 79.5 white count and platelets were normal.  Ferritin levels were low at 7.  B12 levels were normal at greater than 1500.  TSH was normal.  Looking back at her prior CBCs patient's hemoglobin has been between 11-12 up until March 2023.  In July 2023 it dropped down to 9.8 patient has seen Dr. Maryruth from GI for iron deficiency anemia risks and benefits of endoscopic evaluation were discussed with the patient.  Given her age endoscopy evaluation was not pursued.      Interval history-she is doing well for her age.  Denies any blood in her stool or urine.  Denies any dark melanotic stools.  Energy levels are overall stable.  ECOG PS- 2 Pain scale- 0   Review of systems- Review of Systems  Constitutional:  Negative for chills, fever, malaise/fatigue and weight loss.  HENT:  Negative for congestion, ear discharge and nosebleeds.   Eyes:  Negative for blurred vision.  Respiratory:  Negative for cough, hemoptysis, sputum production, shortness of breath and wheezing.   Cardiovascular:  Negative for chest pain, palpitations, orthopnea and claudication.  Gastrointestinal:  Negative for abdominal pain, blood in stool, constipation, diarrhea, heartburn, melena, nausea and vomiting.   Genitourinary:  Negative for dysuria, flank pain, frequency, hematuria and urgency.  Musculoskeletal:  Negative for back pain, joint pain and myalgias.  Skin:  Negative for rash.  Neurological:  Negative for dizziness, tingling, focal weakness, seizures, weakness and headaches.  Endo/Heme/Allergies:  Does not bruise/bleed easily.  Psychiatric/Behavioral:  Negative for depression and suicidal ideas. The patient does not have insomnia.       Allergies  Allergen Reactions   Meloxicam Other (See Comments)    Dizziness   Mirtazapine Other (See Comments)    Severe fatigue.   Penicillins Rash     Past Medical History:  Diagnosis Date   Arthritis    left knee   Diabetes mellitus without complication (HCC)    Patient takes Metformin and Insulin    GERD (gastroesophageal reflux disease)    Hypertension    Pulmonary emboli (HCC) 12/2016   Wears dentures    full upper, partial lower     Past Surgical History:  Procedure Laterality Date   ABDOMINAL HYSTERECTOMY     CATARACT EXTRACTION W/PHACO Right 09/18/2017   Procedure: CATARACT EXTRACTION PHACO AND INTRAOCULAR LENS PLACEMENT (IOC)  DIABETIC RIGHT;  Surgeon: Myrna Adine Anes, MD;  Location: [facility];  Service: Ophthalmology;  Laterality: Right;  Diabetic - insulin  and oral meds   CHOLECYSTECTOMY      Social History   Socioeconomic History   Marital status: Widowed    Spouse name: Not on file   Number of children: Not on file   Years of education: Not on file   Highest education level: Not on file  Occupational History  Not on file  Tobacco Use   Smoking status: Former    Current packs/day: 0.00    Types: Cigarettes    Quit date: 1970    Years since quitting: 55.6   Smokeless tobacco: Never  Vaping Use   Vaping status: Never Used  Substance and Sexual Activity   Alcohol use: No   Drug use: No   Sexual activity: Not Currently  Other Topics Concern   Not on file  Social History Narrative   Not on  file   Social Drivers of Health   Financial Resource Strain: Low Risk  (04/07/2024)   Received from [facility]   Overall Financial Resource Strain (CARDIA)    Difficulty of Paying Living Expenses: Not hard at all  Food Insecurity: No Food Insecurity (04/07/2024)   Received from [facility]   Hunger Vital Sign    Within the past 12 months, you worried that your food would run out before you got the money to buy more.: Never true    Within the past 12 months, the food you bought just didn't last and you didn't have money to get more.: Never true  Transportation Needs: No Transportation Needs (04/07/2024)   Received from [facility] - Transportation    In the past 12 months, has lack of transportation kept you from medical appointments or from getting medications?: No    Lack of Transportation (Non-Medical): No  Physical Activity: Not on file  Stress: Not on file  Social Connections: Not on file  Intimate Partner Violence: Not on file    Family History  Problem Relation Age of Onset   Dementia Mother    Diabetes Father    Cancer Paternal Aunt      Current Outpatient Medications:    amLODipine (NORVASC) 2.5 MG tablet, Take by mouth., Disp: , Rfl:    aspirin EC 81 MG tablet, Take 81 mg by mouth daily., Disp: , Rfl:    cephALEXin  (KEFLEX ) 500 MG capsule, Take 1 capsule (500 mg total) by mouth 2 (two) times daily. (Patient not taking: Reported on 01/23/2024), Disp: 14 capsule, Rfl: 0   docusate sodium  (DOK) 100 MG capsule, Take 100 mg by mouth 2 (two) times daily. (Patient not taking: Reported on 07/23/2023), Disp: , Rfl:    escitalopram (LEXAPRO) 5 MG tablet, Take by mouth., Disp: , Rfl:    GNP EASY TOUCH GLUCOSE TEST test strip, 2 (two) times daily. , Disp: , Rfl: 1   Insulin  Aspart FlexPen (NOVOLOG ) 100 UNIT/ML, Inject 12 units before the LUNCH meal only (Patient not taking: Reported on 01/23/2024), Disp: , Rfl:    insulin   lispro (HUMALOG) 100 UNIT/ML KwikPen, Inject 10 units before the LUNCH meal only, Disp: , Rfl:    iron polysaccharides (NIFEREX) 150 MG capsule, Take 150 mg by mouth daily. (Patient not taking: Reported on 07/23/2023), Disp: , Rfl:    LANTUS SOLOSTAR 100 UNIT/ML Solostar Pen, Inject 20 Units into the skin at bedtime. , Disp: , Rfl:    memantine (NAMENDA) 10 MG tablet, Take 5mg  in the morning and 10mg  at night for one week, then increase to 10mg  twice daily., Disp: , Rfl:    metFORMIN (GLUCOPHAGE) 500 MG tablet, Take 500 mg by mouth 2 (two) times daily., Disp: , Rfl:    metoCLOPramide (REGLAN) 10 MG tablet, Take 10 mg by mouth 3 (three) times daily., Disp: , Rfl:    metoprolol tartrate (LOPRESSOR) 50 MG tablet,  Take 50 mg by mouth 2 (two) times daily. (Patient not taking: Reported on 01/23/2024), Disp: , Rfl:    neomycin-polymyxin b-dexamethasone (MAXITROL) 3.5-10000-0.1 SUSP, , Disp: , Rfl:    omeprazole (PRILOSEC OTC) 20 MG tablet, Take by mouth., Disp: , Rfl:    rosuvastatin (CRESTOR) 10 MG tablet, Take 10 mg by mouth daily., Disp: , Rfl:    rosuvastatin (CRESTOR) 10 MG tablet, Take 1 tablet by mouth daily., Disp: , Rfl:    senna (SENNA-TIME) 8.6 MG tablet, Take 2 tablets by mouth daily. (Patient not taking: Reported on 07/23/2023), Disp: , Rfl:    TRADJENTA 5 MG TABS tablet, Take 5 mg by mouth daily., Disp: , Rfl:   Physical exam: There were no vitals filed for this visit. Physical Exam Constitutional:      Comments: Sitting in a wheelchair and appears in no acute distress  Cardiovascular:     Rate and Rhythm: Normal rate and regular rhythm.     Heart sounds: Normal heart sounds.  Pulmonary:     Effort: Pulmonary effort is normal.     Breath sounds: Normal breath sounds.  Skin:    General: Skin is warm and dry.  Neurological:     Mental Status: She is alert and oriented to person, place, and time.      I have personally reviewed labs listed below:    Latest Ref Rng & Units 11/20/2023     5:38 PM  CMP  Glucose 70 - 99 mg/dL 624   BUN 8 - 23 mg/dL 35   Creatinine 9.55 - 1.00 mg/dL 8.68   Sodium 864 - 854 mmol/L 133   Potassium 3.5 - 5.1 mmol/L 4.4   Chloride 98 - 111 mmol/L 102   CO2 22 - 32 mmol/L 20   Calcium 8.9 - 10.3 mg/dL 9.1       Latest Ref Rng & Units 07/22/2024    1:15 PM  CBC  WBC 4.0 - 10.5 K/uL 4.8   Hemoglobin 12.0 - 15.0 g/dL 87.5   Hematocrit 63.9 - 46.0 % 36.4   Platelets 150 - 400 K/uL 180       Assessment and plan- Patient is a 89 y.o. female here for routine follow-up of iron deficiency anemia  Patient is not presently anemic with an H&H of 12.4/36.4.  Iron studies from today are pending.  Patient last received IV iron in February 2025.  If she is in need of IV iron based on today's labs we will call the patient.  Patient will continue to follow-up with her primary care doctor at this time.  She can be referred to us  in the future if questions or concerns arise Visit Diagnosis 1. Iron deficiency anemia, unspecified iron deficiency anemia type      Dr. Annah Skene, MD, MPH [facility] [phone] 07/22/2024 1:32 PM

## 2024-11-21 ENCOUNTER — Other Ambulatory Visit

## 2025-03-23 ENCOUNTER — Other Ambulatory Visit

## 2025-03-23 ENCOUNTER — Ambulatory Visit: Admitting: Oncology
